# Patient Record
Sex: Female | Born: 1980
Health system: Southern US, Community
[De-identification: ages and names within clinical notes are randomized; demographics above are authoritative.]

## PROBLEM LIST (undated history)

## (undated) DIAGNOSIS — G40909 Epilepsy, unspecified, not intractable, without status epilepticus: Secondary | ICD-10-CM

---

## 2001-11-19 ENCOUNTER — Other Ambulatory Visit: Admission: RE | Admit: 2001-11-19 | Discharge: 2001-11-19 | Payer: Self-pay | Admitting: Family Medicine

## 2006-10-26 ENCOUNTER — Emergency Department (HOSPITAL_COMMUNITY): Admission: EM | Admit: 2006-10-26 | Discharge: 2006-10-27 | Payer: Self-pay | Admitting: *Deleted

## 2007-01-14 ENCOUNTER — Emergency Department (HOSPITAL_COMMUNITY): Admission: EM | Admit: 2007-01-14 | Discharge: 2007-01-14 | Payer: Self-pay | Admitting: Emergency Medicine

## 2007-05-18 ENCOUNTER — Emergency Department (HOSPITAL_COMMUNITY): Admission: EM | Admit: 2007-05-18 | Discharge: 2007-05-18 | Payer: Self-pay | Admitting: Emergency Medicine

## 2007-07-14 ENCOUNTER — Emergency Department (HOSPITAL_COMMUNITY): Admission: EM | Admit: 2007-07-14 | Discharge: 2007-07-14 | Payer: Self-pay | Admitting: Emergency Medicine

## 2007-12-15 ENCOUNTER — Encounter: Payer: Self-pay | Admitting: Emergency Medicine

## 2007-12-15 ENCOUNTER — Inpatient Hospital Stay (HOSPITAL_COMMUNITY): Admission: AD | Admit: 2007-12-15 | Discharge: 2007-12-16 | Payer: Self-pay | Admitting: *Deleted

## 2008-01-14 ENCOUNTER — Emergency Department (HOSPITAL_COMMUNITY): Admission: EM | Admit: 2008-01-14 | Discharge: 2008-01-14 | Payer: Self-pay | Admitting: Emergency Medicine

## 2008-11-20 ENCOUNTER — Emergency Department (HOSPITAL_COMMUNITY): Admission: EM | Admit: 2008-11-20 | Discharge: 2008-11-20 | Payer: Self-pay | Admitting: Emergency Medicine

## 2010-12-12 LAB — URINALYSIS, ROUTINE W REFLEX MICROSCOPIC
Bilirubin Urine: NEGATIVE
Glucose, UA: NEGATIVE mg/dL
Protein, ur: NEGATIVE mg/dL

## 2010-12-12 LAB — URINE MICROSCOPIC-ADD ON
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
## 2011-05-27 LAB — WET PREP, GENITAL
Clue Cells Wet Prep HPF POC: NONE SEEN
Trich, Wet Prep: NONE SEEN
Yeast Wet Prep HPF POC: NONE SEEN

## 2011-05-27 LAB — COMPREHENSIVE METABOLIC PANEL
ALT: 91 — ABNORMAL HIGH
AST: 23
CO2: 25
Chloride: 106
GFR calc Af Amer: >60
GFR calc non Af Amer: >60
Glucose, Bld: 98
Sodium: 139
Total Bilirubin: 0.4

## 2011-05-27 LAB — DIFFERENTIAL
Basophils Absolute: 0.1
Basophils Relative: 1
Basophils Relative: 0
Eosinophils Absolute: 0.2
Eosinophils Relative: 2
Lymphocytes Relative: 22
Lymphs Abs: 2.8
Monocytes Absolute: 1
Monocytes Relative: 8
Monocytes Relative: 9
Neutro Abs: 8.4 — ABNORMAL HIGH
Neutro Abs: 7.6
Neutrophils Relative %: 68
Neutrophils Relative %: 70

## 2011-05-27 LAB — CBC
HCT: 36
Hemoglobin: 12.3
MCHC: 34.1
MCHC: 34.8
MCV: 94
Platelets: 267
RBC: 3.83 — ABNORMAL LOW
RBC: 3.36 — ABNORMAL LOW
RDW: 15.1
WBC: 12.4 — ABNORMAL HIGH
WBC: 10.8 — ABNORMAL HIGH

## 2011-05-27 LAB — GC/CHLAMYDIA PROBE AMP, GENITAL
Chlamydia, DNA Probe: NEGATIVE
GC Probe Amp, Genital: NEGATIVE

## 2011-05-27 LAB — COMPREHENSIVE METABOLIC PANEL WITH GFR
Albumin: 3.4 — ABNORMAL LOW
Alkaline Phosphatase: 102
BUN: 13
Calcium: 8.6
Creatinine, Ser: 0.74
Potassium: 4.3
Total Protein: 6.4

## 2011-05-27 LAB — URINALYSIS, ROUTINE W REFLEX MICROSCOPIC
Bilirubin Urine: NEGATIVE
Glucose, UA: NEGATIVE
Hgb urine dipstick: NEGATIVE
Ketones, ur: NEGATIVE
Nitrite: NEGATIVE
Protein, ur: NEGATIVE
Specific Gravity, Urine: 1.016
Urobilinogen, UA: 0.2
pH: 6.5

## 2011-05-27 LAB — RPR: RPR Ser Ql: NONREACTIVE

## 2011-05-27 LAB — LIPASE, BLOOD: Lipase: 61 — ABNORMAL HIGH

## 2011-05-28 LAB — COMPREHENSIVE METABOLIC PANEL WITH GFR
AST: 19
BUN: 13
CO2: 25
Calcium: 8.9
Chloride: 103
Creatinine, Ser: 0.65
GFR calc non Af Amer: >60
Glucose, Bld: 105 — ABNORMAL HIGH
Total Bilirubin: 0.5

## 2011-05-28 LAB — URINALYSIS, ROUTINE W REFLEX MICROSCOPIC
Bilirubin Urine: NEGATIVE
Glucose, UA: NEGATIVE
Hgb urine dipstick: NEGATIVE
Ketones, ur: NEGATIVE
Nitrite: NEGATIVE
Protein, ur: NEGATIVE
Specific Gravity, Urine: 1.023
Urobilinogen, UA: 0.2
pH: 6

## 2011-05-28 LAB — COMPREHENSIVE METABOLIC PANEL
ALT: 15
Albumin: 3.4 — ABNORMAL LOW
Alkaline Phosphatase: 106
GFR calc Af Amer: >60
Potassium: 3.7
Sodium: 138
Total Protein: 6.4

## 2011-05-28 LAB — DIFFERENTIAL
Basophils Absolute: 0.1
Basophils Relative: 0
Eosinophils Absolute: 0.1
Eosinophils Relative: 1
Lymphocytes Relative: 20
Lymphs Abs: 2.7
Monocytes Absolute: 1
Monocytes Relative: 7
Neutro Abs: 9.9 — ABNORMAL HIGH
Neutrophils Relative %: 72

## 2011-05-28 LAB — CBC
HCT: 34.5 — ABNORMAL LOW
Hemoglobin: 12.2
MCHC: 35.4
MCV: 93.7
Platelets: 190
RBC: 3.69 — ABNORMAL LOW
RDW: 13.6
WBC: 13.7 — ABNORMAL HIGH

## 2011-05-28 LAB — RPR: RPR Ser Ql: NONREACTIVE

## 2011-05-28 LAB — WET PREP, GENITAL
Clue Cells Wet Prep HPF POC: NONE SEEN
Trich, Wet Prep: NONE SEEN
WBC, Wet Prep HPF POC: NONE SEEN
Yeast Wet Prep HPF POC: NONE SEEN

## 2011-05-28 LAB — POCT PREGNANCY, URINE
Operator id: 161631
Preg Test, Ur: NEGATIVE

## 2011-05-28 LAB — GC/CHLAMYDIA PROBE AMP, GENITAL
Chlamydia, DNA Probe: NEGATIVE
GC Probe Amp, Genital: NEGATIVE

## 2015-09-12 DIAGNOSIS — Z79899 Other long term (current) drug therapy: Secondary | ICD-10-CM | POA: Diagnosis not present

## 2015-09-12 DIAGNOSIS — J029 Acute pharyngitis, unspecified: Secondary | ICD-10-CM | POA: Diagnosis not present

## 2015-09-12 DIAGNOSIS — B9789 Other viral agents as the cause of diseases classified elsewhere: Secondary | ICD-10-CM | POA: Diagnosis not present

## 2015-09-12 DIAGNOSIS — H9209 Otalgia, unspecified ear: Secondary | ICD-10-CM | POA: Diagnosis not present

## 2015-09-12 DIAGNOSIS — J069 Acute upper respiratory infection, unspecified: Secondary | ICD-10-CM | POA: Diagnosis not present

## 2015-09-12 DIAGNOSIS — G40909 Epilepsy, unspecified, not intractable, without status epilepticus: Secondary | ICD-10-CM | POA: Diagnosis not present

## 2015-09-27 DIAGNOSIS — F3289 Other specified depressive episodes: Secondary | ICD-10-CM | POA: Diagnosis not present

## 2015-09-27 DIAGNOSIS — Z760 Encounter for issue of repeat prescription: Secondary | ICD-10-CM | POA: Diagnosis not present

## 2015-09-27 DIAGNOSIS — F418 Other specified anxiety disorders: Secondary | ICD-10-CM | POA: Diagnosis not present

## 2015-09-27 DIAGNOSIS — M792 Neuralgia and neuritis, unspecified: Secondary | ICD-10-CM | POA: Diagnosis not present

## 2015-09-27 DIAGNOSIS — F319 Bipolar disorder, unspecified: Secondary | ICD-10-CM | POA: Diagnosis not present

## 2015-09-28 DIAGNOSIS — L600 Ingrowing nail: Secondary | ICD-10-CM | POA: Diagnosis not present

## 2015-10-03 DIAGNOSIS — L600 Ingrowing nail: Secondary | ICD-10-CM | POA: Diagnosis not present

## 2015-10-10 DIAGNOSIS — L600 Ingrowing nail: Secondary | ICD-10-CM | POA: Diagnosis not present

## 2015-11-09 DIAGNOSIS — F17210 Nicotine dependence, cigarettes, uncomplicated: Secondary | ICD-10-CM | POA: Diagnosis not present

## 2015-11-09 DIAGNOSIS — Z885 Allergy status to narcotic agent status: Secondary | ICD-10-CM | POA: Diagnosis not present

## 2015-11-09 DIAGNOSIS — Z7982 Long term (current) use of aspirin: Secondary | ICD-10-CM | POA: Diagnosis not present

## 2015-11-09 DIAGNOSIS — Z888 Allergy status to other drugs, medicaments and biological substances status: Secondary | ICD-10-CM | POA: Diagnosis not present

## 2015-11-09 DIAGNOSIS — T7621XA Adult sexual abuse, suspected, initial encounter: Secondary | ICD-10-CM | POA: Diagnosis not present

## 2015-11-09 DIAGNOSIS — G40909 Epilepsy, unspecified, not intractable, without status epilepticus: Secondary | ICD-10-CM | POA: Diagnosis not present

## 2015-11-09 DIAGNOSIS — M797 Fibromyalgia: Secondary | ICD-10-CM | POA: Diagnosis not present

## 2015-11-09 DIAGNOSIS — M549 Dorsalgia, unspecified: Secondary | ICD-10-CM | POA: Diagnosis not present

## 2015-11-09 DIAGNOSIS — Z79891 Long term (current) use of opiate analgesic: Secondary | ICD-10-CM | POA: Diagnosis not present

## 2015-11-09 DIAGNOSIS — J45909 Unspecified asthma, uncomplicated: Secondary | ICD-10-CM | POA: Diagnosis not present

## 2015-11-09 DIAGNOSIS — T7421XA Adult sexual abuse, confirmed, initial encounter: Secondary | ICD-10-CM | POA: Diagnosis not present

## 2015-11-23 DIAGNOSIS — M797 Fibromyalgia: Secondary | ICD-10-CM | POA: Diagnosis not present

## 2015-11-23 DIAGNOSIS — R1110 Vomiting, unspecified: Secondary | ICD-10-CM | POA: Diagnosis not present

## 2015-11-23 DIAGNOSIS — J45909 Unspecified asthma, uncomplicated: Secondary | ICD-10-CM | POA: Diagnosis not present

## 2015-11-23 DIAGNOSIS — Z8659 Personal history of other mental and behavioral disorders: Secondary | ICD-10-CM | POA: Diagnosis not present

## 2015-11-23 DIAGNOSIS — R569 Unspecified convulsions: Secondary | ICD-10-CM | POA: Diagnosis not present

## 2015-11-23 DIAGNOSIS — F319 Bipolar disorder, unspecified: Secondary | ICD-10-CM | POA: Diagnosis not present

## 2015-11-23 DIAGNOSIS — Z885 Allergy status to narcotic agent status: Secondary | ICD-10-CM | POA: Diagnosis not present

## 2015-11-23 DIAGNOSIS — Z86010 Personal history of colonic polyps: Secondary | ICD-10-CM | POA: Diagnosis not present

## 2015-11-23 DIAGNOSIS — F4310 Post-traumatic stress disorder, unspecified: Secondary | ICD-10-CM | POA: Diagnosis not present

## 2015-11-23 DIAGNOSIS — K219 Gastro-esophageal reflux disease without esophagitis: Secondary | ICD-10-CM | POA: Diagnosis not present

## 2015-11-23 DIAGNOSIS — R112 Nausea with vomiting, unspecified: Secondary | ICD-10-CM | POA: Diagnosis not present

## 2015-11-23 DIAGNOSIS — F419 Anxiety disorder, unspecified: Secondary | ICD-10-CM | POA: Diagnosis not present

## 2015-11-23 DIAGNOSIS — Z8719 Personal history of other diseases of the digestive system: Secondary | ICD-10-CM | POA: Diagnosis not present

## 2015-11-23 DIAGNOSIS — Z79899 Other long term (current) drug therapy: Secondary | ICD-10-CM | POA: Diagnosis not present

## 2015-11-23 DIAGNOSIS — Z87891 Personal history of nicotine dependence: Secondary | ICD-10-CM | POA: Diagnosis not present

## 2015-11-23 DIAGNOSIS — Z888 Allergy status to other drugs, medicaments and biological substances status: Secondary | ICD-10-CM | POA: Diagnosis not present

## 2015-11-23 DIAGNOSIS — R109 Unspecified abdominal pain: Secondary | ICD-10-CM | POA: Diagnosis not present

## 2015-11-24 DIAGNOSIS — Z760 Encounter for issue of repeat prescription: Secondary | ICD-10-CM | POA: Diagnosis not present

## 2015-11-24 DIAGNOSIS — F17210 Nicotine dependence, cigarettes, uncomplicated: Secondary | ICD-10-CM | POA: Diagnosis not present

## 2015-11-24 DIAGNOSIS — M792 Neuralgia and neuritis, unspecified: Secondary | ICD-10-CM | POA: Diagnosis not present

## 2016-04-19 DIAGNOSIS — M792 Neuralgia and neuritis, unspecified: Secondary | ICD-10-CM | POA: Diagnosis not present

## 2016-04-19 DIAGNOSIS — Z79899 Other long term (current) drug therapy: Secondary | ICD-10-CM | POA: Diagnosis not present

## 2016-04-19 DIAGNOSIS — G40909 Epilepsy, unspecified, not intractable, without status epilepticus: Secondary | ICD-10-CM | POA: Diagnosis not present

## 2016-05-23 DIAGNOSIS — Z79899 Other long term (current) drug therapy: Secondary | ICD-10-CM | POA: Diagnosis not present

## 2016-05-23 DIAGNOSIS — M792 Neuralgia and neuritis, unspecified: Secondary | ICD-10-CM | POA: Diagnosis not present

## 2016-05-23 DIAGNOSIS — F17210 Nicotine dependence, cigarettes, uncomplicated: Secondary | ICD-10-CM | POA: Diagnosis not present

## 2016-05-23 DIAGNOSIS — G40909 Epilepsy, unspecified, not intractable, without status epilepticus: Secondary | ICD-10-CM | POA: Diagnosis not present

## 2016-05-25 DIAGNOSIS — Z5320 Procedure and treatment not carried out because of patient's decision for unspecified reasons: Secondary | ICD-10-CM | POA: Diagnosis not present

## 2016-05-25 DIAGNOSIS — R252 Cramp and spasm: Secondary | ICD-10-CM | POA: Diagnosis not present

## 2016-06-14 DIAGNOSIS — J45909 Unspecified asthma, uncomplicated: Secondary | ICD-10-CM | POA: Diagnosis not present

## 2016-06-14 DIAGNOSIS — N390 Urinary tract infection, site not specified: Secondary | ICD-10-CM | POA: Diagnosis not present

## 2016-06-14 DIAGNOSIS — G629 Polyneuropathy, unspecified: Secondary | ICD-10-CM | POA: Diagnosis not present

## 2016-06-14 DIAGNOSIS — Z885 Allergy status to narcotic agent status: Secondary | ICD-10-CM | POA: Diagnosis not present

## 2016-06-14 DIAGNOSIS — Z888 Allergy status to other drugs, medicaments and biological substances status: Secondary | ICD-10-CM | POA: Diagnosis not present

## 2016-06-14 DIAGNOSIS — G40909 Epilepsy, unspecified, not intractable, without status epilepticus: Secondary | ICD-10-CM | POA: Diagnosis not present

## 2016-06-14 DIAGNOSIS — F17210 Nicotine dependence, cigarettes, uncomplicated: Secondary | ICD-10-CM | POA: Diagnosis not present

## 2016-07-14 DIAGNOSIS — D72829 Elevated white blood cell count, unspecified: Secondary | ICD-10-CM | POA: Diagnosis not present

## 2016-07-14 DIAGNOSIS — L03315 Cellulitis of perineum: Secondary | ICD-10-CM | POA: Diagnosis not present

## 2016-07-14 DIAGNOSIS — R1909 Other intra-abdominal and pelvic swelling, mass and lump: Secondary | ICD-10-CM | POA: Diagnosis not present

## 2016-07-14 DIAGNOSIS — N9489 Other specified conditions associated with female genital organs and menstrual cycle: Secondary | ICD-10-CM | POA: Diagnosis not present

## 2016-07-14 DIAGNOSIS — N762 Acute vulvitis: Secondary | ICD-10-CM | POA: Diagnosis not present

## 2016-07-15 DIAGNOSIS — R569 Unspecified convulsions: Secondary | ICD-10-CM | POA: Diagnosis not present

## 2016-07-15 DIAGNOSIS — N762 Acute vulvitis: Secondary | ICD-10-CM | POA: Diagnosis not present

## 2016-07-16 DIAGNOSIS — N762 Acute vulvitis: Secondary | ICD-10-CM | POA: Diagnosis not present

## 2016-07-16 DIAGNOSIS — R569 Unspecified convulsions: Secondary | ICD-10-CM | POA: Diagnosis not present

## 2016-08-13 DIAGNOSIS — R0789 Other chest pain: Secondary | ICD-10-CM | POA: Diagnosis not present

## 2016-08-13 DIAGNOSIS — K047 Periapical abscess without sinus: Secondary | ICD-10-CM | POA: Diagnosis not present

## 2016-09-11 DIAGNOSIS — S61302A Unspecified open wound of right middle finger with damage to nail, initial encounter: Secondary | ICD-10-CM | POA: Diagnosis not present

## 2016-09-11 DIAGNOSIS — F17200 Nicotine dependence, unspecified, uncomplicated: Secondary | ICD-10-CM | POA: Diagnosis not present

## 2016-09-11 DIAGNOSIS — S61212A Laceration without foreign body of right middle finger without damage to nail, initial encounter: Secondary | ICD-10-CM | POA: Diagnosis not present

## 2016-09-11 DIAGNOSIS — R569 Unspecified convulsions: Secondary | ICD-10-CM | POA: Diagnosis not present

## 2016-09-11 DIAGNOSIS — S61409A Unspecified open wound of unspecified hand, initial encounter: Secondary | ICD-10-CM | POA: Diagnosis not present

## 2016-11-23 DIAGNOSIS — L0211 Cutaneous abscess of neck: Secondary | ICD-10-CM | POA: Diagnosis not present

## 2016-11-23 DIAGNOSIS — Z885 Allergy status to narcotic agent status: Secondary | ICD-10-CM | POA: Diagnosis not present

## 2016-11-23 DIAGNOSIS — Z880 Allergy status to penicillin: Secondary | ICD-10-CM | POA: Diagnosis not present

## 2016-11-23 DIAGNOSIS — R569 Unspecified convulsions: Secondary | ICD-10-CM | POA: Diagnosis not present

## 2016-11-23 DIAGNOSIS — F17200 Nicotine dependence, unspecified, uncomplicated: Secondary | ICD-10-CM | POA: Diagnosis not present

## 2016-11-25 DIAGNOSIS — S1096XA Insect bite of unspecified part of neck, initial encounter: Secondary | ICD-10-CM | POA: Diagnosis not present

## 2016-11-25 DIAGNOSIS — R1110 Vomiting, unspecified: Secondary | ICD-10-CM | POA: Diagnosis not present

## 2016-11-25 DIAGNOSIS — L03221 Cellulitis of neck: Secondary | ICD-10-CM | POA: Diagnosis not present

## 2016-11-25 DIAGNOSIS — L0211 Cutaneous abscess of neck: Secondary | ICD-10-CM | POA: Diagnosis not present

## 2016-12-23 DIAGNOSIS — N6320 Unspecified lump in the left breast, unspecified quadrant: Secondary | ICD-10-CM | POA: Diagnosis not present

## 2016-12-23 DIAGNOSIS — N7011 Chronic salpingitis: Secondary | ICD-10-CM | POA: Diagnosis not present

## 2016-12-23 DIAGNOSIS — N6452 Nipple discharge: Secondary | ICD-10-CM | POA: Diagnosis not present

## 2016-12-23 DIAGNOSIS — R102 Pelvic and perineal pain: Secondary | ICD-10-CM | POA: Diagnosis not present

## 2016-12-25 DIAGNOSIS — N6322 Unspecified lump in the left breast, upper inner quadrant: Secondary | ICD-10-CM | POA: Diagnosis not present

## 2016-12-25 DIAGNOSIS — N6452 Nipple discharge: Secondary | ICD-10-CM | POA: Diagnosis not present

## 2016-12-29 DIAGNOSIS — R928 Other abnormal and inconclusive findings on diagnostic imaging of breast: Secondary | ICD-10-CM | POA: Diagnosis not present

## 2017-01-13 DIAGNOSIS — D242 Benign neoplasm of left breast: Secondary | ICD-10-CM | POA: Diagnosis not present

## 2017-01-13 DIAGNOSIS — N61 Mastitis without abscess: Secondary | ICD-10-CM | POA: Diagnosis not present

## 2017-01-13 DIAGNOSIS — R928 Other abnormal and inconclusive findings on diagnostic imaging of breast: Secondary | ICD-10-CM | POA: Diagnosis not present

## 2017-01-13 DIAGNOSIS — N611 Abscess of the breast and nipple: Secondary | ICD-10-CM | POA: Diagnosis not present

## 2017-01-16 DIAGNOSIS — R509 Fever, unspecified: Secondary | ICD-10-CM | POA: Diagnosis not present

## 2017-01-16 DIAGNOSIS — J45909 Unspecified asthma, uncomplicated: Secondary | ICD-10-CM | POA: Diagnosis not present

## 2017-01-16 DIAGNOSIS — G8929 Other chronic pain: Secondary | ICD-10-CM | POA: Diagnosis not present

## 2017-01-16 DIAGNOSIS — B9789 Other viral agents as the cause of diseases classified elsewhere: Secondary | ICD-10-CM | POA: Diagnosis not present

## 2017-01-16 DIAGNOSIS — M791 Myalgia: Secondary | ICD-10-CM | POA: Diagnosis not present

## 2017-01-16 DIAGNOSIS — R05 Cough: Secondary | ICD-10-CM | POA: Diagnosis not present

## 2017-01-16 DIAGNOSIS — T814XXA Infection following a procedure, initial encounter: Secondary | ICD-10-CM | POA: Diagnosis not present

## 2017-01-16 DIAGNOSIS — Z880 Allergy status to penicillin: Secondary | ICD-10-CM | POA: Diagnosis not present

## 2017-01-16 DIAGNOSIS — H9209 Otalgia, unspecified ear: Secondary | ICD-10-CM | POA: Diagnosis not present

## 2017-01-16 DIAGNOSIS — F329 Major depressive disorder, single episode, unspecified: Secondary | ICD-10-CM | POA: Diagnosis not present

## 2017-01-16 DIAGNOSIS — N390 Urinary tract infection, site not specified: Secondary | ICD-10-CM | POA: Diagnosis not present

## 2017-01-16 DIAGNOSIS — G20 Parkinson's disease: Secondary | ICD-10-CM | POA: Diagnosis not present

## 2017-01-16 DIAGNOSIS — F17200 Nicotine dependence, unspecified, uncomplicated: Secondary | ICD-10-CM | POA: Diagnosis not present

## 2017-01-16 DIAGNOSIS — Z885 Allergy status to narcotic agent status: Secondary | ICD-10-CM | POA: Diagnosis not present

## 2017-01-16 DIAGNOSIS — D680 Von Willebrand's disease: Secondary | ICD-10-CM | POA: Diagnosis not present

## 2017-01-16 DIAGNOSIS — R0981 Nasal congestion: Secondary | ICD-10-CM | POA: Diagnosis not present

## 2017-01-29 DIAGNOSIS — Z5329 Procedure and treatment not carried out because of patient's decision for other reasons: Secondary | ICD-10-CM | POA: Diagnosis not present

## 2017-01-29 DIAGNOSIS — L7682 Other postprocedural complications of skin and subcutaneous tissue: Secondary | ICD-10-CM | POA: Diagnosis not present

## 2017-01-30 DIAGNOSIS — N644 Mastodynia: Secondary | ICD-10-CM | POA: Diagnosis not present

## 2017-01-30 DIAGNOSIS — Z9889 Other specified postprocedural states: Secondary | ICD-10-CM | POA: Diagnosis not present

## 2017-01-30 DIAGNOSIS — G8918 Other acute postprocedural pain: Secondary | ICD-10-CM | POA: Diagnosis not present

## 2017-05-03 DIAGNOSIS — S43101A Unspecified dislocation of right acromioclavicular joint, initial encounter: Secondary | ICD-10-CM | POA: Diagnosis not present

## 2017-07-23 DIAGNOSIS — N898 Other specified noninflammatory disorders of vagina: Secondary | ICD-10-CM | POA: Diagnosis not present

## 2017-07-23 DIAGNOSIS — Z202 Contact with and (suspected) exposure to infections with a predominantly sexual mode of transmission: Secondary | ICD-10-CM | POA: Diagnosis not present

## 2017-07-23 DIAGNOSIS — R102 Pelvic and perineal pain: Secondary | ICD-10-CM | POA: Diagnosis not present

## 2017-07-23 DIAGNOSIS — N766 Ulceration of vulva: Secondary | ICD-10-CM | POA: Diagnosis not present

## 2017-07-23 DIAGNOSIS — R110 Nausea: Secondary | ICD-10-CM | POA: Diagnosis not present

## 2017-08-27 DIAGNOSIS — G20 Parkinson's disease: Secondary | ICD-10-CM | POA: Diagnosis not present

## 2017-08-27 DIAGNOSIS — K089 Disorder of teeth and supporting structures, unspecified: Secondary | ICD-10-CM | POA: Diagnosis not present

## 2017-08-27 DIAGNOSIS — Z9889 Other specified postprocedural states: Secondary | ICD-10-CM | POA: Diagnosis not present

## 2017-08-27 DIAGNOSIS — Z90710 Acquired absence of both cervix and uterus: Secondary | ICD-10-CM | POA: Diagnosis not present

## 2017-08-27 DIAGNOSIS — K219 Gastro-esophageal reflux disease without esophagitis: Secondary | ICD-10-CM | POA: Diagnosis not present

## 2017-08-27 DIAGNOSIS — Z720 Tobacco use: Secondary | ICD-10-CM | POA: Diagnosis not present

## 2017-08-27 DIAGNOSIS — R220 Localized swelling, mass and lump, head: Secondary | ICD-10-CM | POA: Diagnosis not present

## 2017-08-27 DIAGNOSIS — L0201 Cutaneous abscess of face: Secondary | ICD-10-CM | POA: Diagnosis not present

## 2017-08-27 DIAGNOSIS — Z79899 Other long term (current) drug therapy: Secondary | ICD-10-CM | POA: Diagnosis not present

## 2017-08-27 DIAGNOSIS — Z9049 Acquired absence of other specified parts of digestive tract: Secondary | ICD-10-CM | POA: Diagnosis not present

## 2017-08-27 DIAGNOSIS — J45909 Unspecified asthma, uncomplicated: Secondary | ICD-10-CM | POA: Diagnosis not present

## 2017-08-27 DIAGNOSIS — Z888 Allergy status to other drugs, medicaments and biological substances status: Secondary | ICD-10-CM | POA: Diagnosis not present

## 2017-08-27 DIAGNOSIS — R569 Unspecified convulsions: Secondary | ICD-10-CM | POA: Diagnosis not present

## 2017-08-27 DIAGNOSIS — F419 Anxiety disorder, unspecified: Secondary | ICD-10-CM | POA: Diagnosis not present

## 2017-08-27 DIAGNOSIS — F17210 Nicotine dependence, cigarettes, uncomplicated: Secondary | ICD-10-CM | POA: Diagnosis not present

## 2017-08-27 DIAGNOSIS — F4310 Post-traumatic stress disorder, unspecified: Secondary | ICD-10-CM | POA: Diagnosis not present

## 2017-08-27 DIAGNOSIS — Z885 Allergy status to narcotic agent status: Secondary | ICD-10-CM | POA: Diagnosis not present

## 2017-08-27 DIAGNOSIS — F319 Bipolar disorder, unspecified: Secondary | ICD-10-CM | POA: Diagnosis not present

## 2017-08-27 DIAGNOSIS — K047 Periapical abscess without sinus: Secondary | ICD-10-CM | POA: Diagnosis not present

## 2017-08-27 DIAGNOSIS — M797 Fibromyalgia: Secondary | ICD-10-CM | POA: Diagnosis not present

## 2017-08-27 DIAGNOSIS — K0889 Other specified disorders of teeth and supporting structures: Secondary | ICD-10-CM | POA: Diagnosis not present

## 2017-09-08 DIAGNOSIS — R9401 Abnormal electroencephalogram [EEG]: Secondary | ICD-10-CM | POA: Diagnosis not present

## 2017-09-08 DIAGNOSIS — E876 Hypokalemia: Secondary | ICD-10-CM | POA: Diagnosis not present

## 2017-09-08 DIAGNOSIS — G40909 Epilepsy, unspecified, not intractable, without status epilepticus: Secondary | ICD-10-CM | POA: Diagnosis not present

## 2017-09-08 DIAGNOSIS — R569 Unspecified convulsions: Secondary | ICD-10-CM | POA: Diagnosis not present

## 2017-09-08 DIAGNOSIS — R4182 Altered mental status, unspecified: Secondary | ICD-10-CM | POA: Diagnosis not present

## 2017-10-13 DIAGNOSIS — M79671 Pain in right foot: Secondary | ICD-10-CM | POA: Diagnosis not present

## 2017-10-13 DIAGNOSIS — S99921A Unspecified injury of right foot, initial encounter: Secondary | ICD-10-CM | POA: Diagnosis not present

## 2017-10-29 DIAGNOSIS — W01198A Fall on same level from slipping, tripping and stumbling with subsequent striking against other object, initial encounter: Secondary | ICD-10-CM | POA: Diagnosis not present

## 2017-10-29 DIAGNOSIS — R569 Unspecified convulsions: Secondary | ICD-10-CM | POA: Diagnosis not present

## 2017-10-29 DIAGNOSIS — R51 Headache: Secondary | ICD-10-CM | POA: Diagnosis not present

## 2017-10-29 DIAGNOSIS — Z91120 Patient's intentional underdosing of medication regimen due to financial hardship: Secondary | ICD-10-CM | POA: Diagnosis not present

## 2017-10-29 DIAGNOSIS — Y999 Unspecified external cause status: Secondary | ICD-10-CM | POA: Diagnosis not present

## 2017-10-30 DIAGNOSIS — R569 Unspecified convulsions: Secondary | ICD-10-CM | POA: Diagnosis not present

## 2017-11-02 DIAGNOSIS — Z79891 Long term (current) use of opiate analgesic: Secondary | ICD-10-CM | POA: Diagnosis not present

## 2017-11-09 DIAGNOSIS — K029 Dental caries, unspecified: Secondary | ICD-10-CM | POA: Diagnosis not present

## 2017-11-09 DIAGNOSIS — X58XXXA Exposure to other specified factors, initial encounter: Secondary | ICD-10-CM | POA: Diagnosis not present

## 2017-11-09 DIAGNOSIS — S025XXA Fracture of tooth (traumatic), initial encounter for closed fracture: Secondary | ICD-10-CM | POA: Diagnosis not present

## 2017-11-09 DIAGNOSIS — Y999 Unspecified external cause status: Secondary | ICD-10-CM | POA: Diagnosis not present

## 2017-11-09 DIAGNOSIS — K0889 Other specified disorders of teeth and supporting structures: Secondary | ICD-10-CM | POA: Diagnosis not present

## 2017-11-09 DIAGNOSIS — K0381 Cracked tooth: Secondary | ICD-10-CM | POA: Diagnosis not present

## 2017-11-09 DIAGNOSIS — K047 Periapical abscess without sinus: Secondary | ICD-10-CM | POA: Diagnosis not present

## 2017-11-23 DIAGNOSIS — E876 Hypokalemia: Secondary | ICD-10-CM | POA: Diagnosis not present

## 2017-11-25 DIAGNOSIS — R0781 Pleurodynia: Secondary | ICD-10-CM | POA: Diagnosis not present

## 2017-11-25 DIAGNOSIS — Y999 Unspecified external cause status: Secondary | ICD-10-CM | POA: Diagnosis not present

## 2017-11-25 DIAGNOSIS — S299XXA Unspecified injury of thorax, initial encounter: Secondary | ICD-10-CM | POA: Diagnosis not present

## 2017-11-25 DIAGNOSIS — S63125A Dislocation of unspecified interphalangeal joint of left thumb, initial encounter: Secondary | ICD-10-CM | POA: Diagnosis not present

## 2017-11-25 DIAGNOSIS — S20212A Contusion of left front wall of thorax, initial encounter: Secondary | ICD-10-CM | POA: Diagnosis not present

## 2017-11-25 DIAGNOSIS — Z3202 Encounter for pregnancy test, result negative: Secondary | ICD-10-CM | POA: Diagnosis not present

## 2017-11-25 DIAGNOSIS — M79642 Pain in left hand: Secondary | ICD-10-CM | POA: Diagnosis not present

## 2017-11-25 DIAGNOSIS — S63291A Dislocation of distal interphalangeal joint of left index finger, initial encounter: Secondary | ICD-10-CM | POA: Diagnosis not present

## 2017-11-27 DIAGNOSIS — Z7689 Persons encountering health services in other specified circumstances: Secondary | ICD-10-CM | POA: Diagnosis not present

## 2017-11-27 DIAGNOSIS — G629 Polyneuropathy, unspecified: Secondary | ICD-10-CM | POA: Diagnosis not present

## 2017-11-27 DIAGNOSIS — G40909 Epilepsy, unspecified, not intractable, without status epilepticus: Secondary | ICD-10-CM | POA: Diagnosis not present

## 2017-11-27 DIAGNOSIS — F909 Attention-deficit hyperactivity disorder, unspecified type: Secondary | ICD-10-CM | POA: Diagnosis not present

## 2017-11-27 DIAGNOSIS — Z6829 Body mass index (BMI) 29.0-29.9, adult: Secondary | ICD-10-CM | POA: Diagnosis not present

## 2017-11-27 DIAGNOSIS — F3130 Bipolar disorder, current episode depressed, mild or moderate severity, unspecified: Secondary | ICD-10-CM | POA: Diagnosis not present

## 2017-12-04 DIAGNOSIS — S62525A Nondisplaced fracture of distal phalanx of left thumb, initial encounter for closed fracture: Secondary | ICD-10-CM | POA: Diagnosis not present

## 2017-12-08 DIAGNOSIS — S63105A Unspecified dislocation of left thumb, initial encounter: Secondary | ICD-10-CM | POA: Diagnosis not present

## 2017-12-08 DIAGNOSIS — Z6828 Body mass index (BMI) 28.0-28.9, adult: Secondary | ICD-10-CM | POA: Diagnosis not present

## 2017-12-24 DIAGNOSIS — Z79899 Other long term (current) drug therapy: Secondary | ICD-10-CM | POA: Diagnosis not present

## 2017-12-24 DIAGNOSIS — F909 Attention-deficit hyperactivity disorder, unspecified type: Secondary | ICD-10-CM | POA: Diagnosis not present

## 2017-12-24 DIAGNOSIS — H60509 Unspecified acute noninfective otitis externa, unspecified ear: Secondary | ICD-10-CM | POA: Diagnosis not present

## 2017-12-24 DIAGNOSIS — Z6828 Body mass index (BMI) 28.0-28.9, adult: Secondary | ICD-10-CM | POA: Diagnosis not present

## 2018-02-22 DIAGNOSIS — M1812 Unilateral primary osteoarthritis of first carpometacarpal joint, left hand: Secondary | ICD-10-CM | POA: Diagnosis not present

## 2018-02-22 DIAGNOSIS — S63279S Dislocation of unspecified interphalangeal joint of unspecified finger, sequela: Secondary | ICD-10-CM | POA: Diagnosis not present

## 2018-03-01 DIAGNOSIS — Z1331 Encounter for screening for depression: Secondary | ICD-10-CM | POA: Diagnosis not present

## 2018-03-01 DIAGNOSIS — F319 Bipolar disorder, unspecified: Secondary | ICD-10-CM | POA: Diagnosis not present

## 2018-03-01 DIAGNOSIS — F321 Major depressive disorder, single episode, moderate: Secondary | ICD-10-CM | POA: Diagnosis not present

## 2018-03-01 DIAGNOSIS — Z6828 Body mass index (BMI) 28.0-28.9, adult: Secondary | ICD-10-CM | POA: Diagnosis not present

## 2018-03-01 DIAGNOSIS — G629 Polyneuropathy, unspecified: Secondary | ICD-10-CM | POA: Diagnosis not present

## 2018-03-01 DIAGNOSIS — F909 Attention-deficit hyperactivity disorder, unspecified type: Secondary | ICD-10-CM | POA: Diagnosis not present

## 2018-03-01 DIAGNOSIS — F4310 Post-traumatic stress disorder, unspecified: Secondary | ICD-10-CM | POA: Diagnosis not present

## 2018-03-03 DIAGNOSIS — S63125D Dislocation of unspecified interphalangeal joint of left thumb, subsequent encounter: Secondary | ICD-10-CM | POA: Diagnosis not present

## 2018-03-03 DIAGNOSIS — S62525D Nondisplaced fracture of distal phalanx of left thumb, subsequent encounter for fracture with routine healing: Secondary | ICD-10-CM | POA: Diagnosis not present

## 2018-03-15 DIAGNOSIS — F321 Major depressive disorder, single episode, moderate: Secondary | ICD-10-CM | POA: Diagnosis not present

## 2018-03-15 DIAGNOSIS — F4310 Post-traumatic stress disorder, unspecified: Secondary | ICD-10-CM | POA: Diagnosis not present

## 2018-03-30 DIAGNOSIS — S62522S Displaced fracture of distal phalanx of left thumb, sequela: Secondary | ICD-10-CM | POA: Diagnosis not present

## 2018-03-30 DIAGNOSIS — M12542 Traumatic arthropathy, left hand: Secondary | ICD-10-CM | POA: Diagnosis not present

## 2018-03-30 DIAGNOSIS — M19142 Post-traumatic osteoarthritis, left hand: Secondary | ICD-10-CM | POA: Diagnosis not present

## 2018-03-30 DIAGNOSIS — G8918 Other acute postprocedural pain: Secondary | ICD-10-CM | POA: Diagnosis not present

## 2018-03-30 DIAGNOSIS — S62522A Displaced fracture of distal phalanx of left thumb, initial encounter for closed fracture: Secondary | ICD-10-CM | POA: Diagnosis not present

## 2018-03-30 DIAGNOSIS — M1832 Unilateral post-traumatic osteoarthritis of first carpometacarpal joint, left hand: Secondary | ICD-10-CM | POA: Diagnosis not present

## 2018-04-12 DIAGNOSIS — Z981 Arthrodesis status: Secondary | ICD-10-CM | POA: Diagnosis not present

## 2018-04-12 DIAGNOSIS — M79645 Pain in left finger(s): Secondary | ICD-10-CM | POA: Diagnosis not present

## 2018-04-16 DIAGNOSIS — S62502A Fracture of unspecified phalanx of left thumb, initial encounter for closed fracture: Secondary | ICD-10-CM | POA: Diagnosis not present

## 2018-04-21 DIAGNOSIS — F321 Major depressive disorder, single episode, moderate: Secondary | ICD-10-CM | POA: Diagnosis not present

## 2018-04-21 DIAGNOSIS — F4310 Post-traumatic stress disorder, unspecified: Secondary | ICD-10-CM | POA: Diagnosis not present

## 2018-04-29 DIAGNOSIS — F909 Attention-deficit hyperactivity disorder, unspecified type: Secondary | ICD-10-CM | POA: Diagnosis not present

## 2018-04-29 DIAGNOSIS — Z6828 Body mass index (BMI) 28.0-28.9, adult: Secondary | ICD-10-CM | POA: Diagnosis not present

## 2018-04-29 DIAGNOSIS — F319 Bipolar disorder, unspecified: Secondary | ICD-10-CM | POA: Diagnosis not present

## 2018-04-30 DIAGNOSIS — Z981 Arthrodesis status: Secondary | ICD-10-CM | POA: Diagnosis not present

## 2018-04-30 DIAGNOSIS — M79645 Pain in left finger(s): Secondary | ICD-10-CM | POA: Diagnosis not present

## 2019-01-11 DIAGNOSIS — F1111 Opioid abuse, in remission: Secondary | ICD-10-CM | POA: Diagnosis not present

## 2019-01-18 DIAGNOSIS — F1111 Opioid abuse, in remission: Secondary | ICD-10-CM | POA: Diagnosis not present

## 2019-01-24 DIAGNOSIS — Z79899 Other long term (current) drug therapy: Secondary | ICD-10-CM | POA: Diagnosis not present

## 2019-01-24 DIAGNOSIS — F1111 Opioid abuse, in remission: Secondary | ICD-10-CM | POA: Diagnosis not present

## 2019-01-24 DIAGNOSIS — F4310 Post-traumatic stress disorder, unspecified: Secondary | ICD-10-CM | POA: Diagnosis not present

## 2019-01-24 DIAGNOSIS — F411 Generalized anxiety disorder: Secondary | ICD-10-CM | POA: Diagnosis not present

## 2019-01-24 DIAGNOSIS — G6289 Other specified polyneuropathies: Secondary | ICD-10-CM | POA: Diagnosis not present

## 2019-01-24 DIAGNOSIS — F17210 Nicotine dependence, cigarettes, uncomplicated: Secondary | ICD-10-CM | POA: Diagnosis not present

## 2019-01-25 DIAGNOSIS — F4310 Post-traumatic stress disorder, unspecified: Secondary | ICD-10-CM | POA: Diagnosis not present

## 2019-01-25 DIAGNOSIS — F411 Generalized anxiety disorder: Secondary | ICD-10-CM | POA: Diagnosis not present

## 2019-01-25 DIAGNOSIS — F329 Major depressive disorder, single episode, unspecified: Secondary | ICD-10-CM | POA: Diagnosis not present

## 2019-01-25 DIAGNOSIS — F1111 Opioid abuse, in remission: Secondary | ICD-10-CM | POA: Diagnosis not present

## 2019-02-01 DIAGNOSIS — F4310 Post-traumatic stress disorder, unspecified: Secondary | ICD-10-CM | POA: Diagnosis not present

## 2019-02-01 DIAGNOSIS — F329 Major depressive disorder, single episode, unspecified: Secondary | ICD-10-CM | POA: Diagnosis not present

## 2019-02-01 DIAGNOSIS — F1111 Opioid abuse, in remission: Secondary | ICD-10-CM | POA: Diagnosis not present

## 2019-02-01 DIAGNOSIS — F411 Generalized anxiety disorder: Secondary | ICD-10-CM | POA: Diagnosis not present

## 2019-02-08 DIAGNOSIS — F1111 Opioid abuse, in remission: Secondary | ICD-10-CM | POA: Diagnosis not present

## 2019-02-15 DIAGNOSIS — F331 Major depressive disorder, recurrent, moderate: Secondary | ICD-10-CM | POA: Diagnosis not present

## 2019-02-15 DIAGNOSIS — Z634 Disappearance and death of family member: Secondary | ICD-10-CM | POA: Diagnosis not present

## 2019-02-15 DIAGNOSIS — F411 Generalized anxiety disorder: Secondary | ICD-10-CM | POA: Diagnosis not present

## 2019-02-15 DIAGNOSIS — F4320 Adjustment disorder, unspecified: Secondary | ICD-10-CM | POA: Diagnosis not present

## 2019-02-19 DIAGNOSIS — M546 Pain in thoracic spine: Secondary | ICD-10-CM | POA: Diagnosis not present

## 2019-02-19 DIAGNOSIS — F411 Generalized anxiety disorder: Secondary | ICD-10-CM | POA: Diagnosis not present

## 2019-02-19 DIAGNOSIS — Z79899 Other long term (current) drug therapy: Secondary | ICD-10-CM | POA: Diagnosis not present

## 2019-02-19 DIAGNOSIS — F1111 Opioid abuse, in remission: Secondary | ICD-10-CM | POA: Diagnosis not present

## 2019-02-19 DIAGNOSIS — F17210 Nicotine dependence, cigarettes, uncomplicated: Secondary | ICD-10-CM | POA: Diagnosis not present

## 2019-02-22 DIAGNOSIS — F1111 Opioid abuse, in remission: Secondary | ICD-10-CM | POA: Diagnosis not present

## 2019-02-28 DIAGNOSIS — J45901 Unspecified asthma with (acute) exacerbation: Secondary | ICD-10-CM | POA: Diagnosis not present

## 2019-03-01 DIAGNOSIS — F329 Major depressive disorder, single episode, unspecified: Secondary | ICD-10-CM | POA: Diagnosis not present

## 2019-03-01 DIAGNOSIS — F4310 Post-traumatic stress disorder, unspecified: Secondary | ICD-10-CM | POA: Diagnosis not present

## 2019-03-01 DIAGNOSIS — F419 Anxiety disorder, unspecified: Secondary | ICD-10-CM | POA: Diagnosis not present

## 2019-03-01 DIAGNOSIS — F17210 Nicotine dependence, cigarettes, uncomplicated: Secondary | ICD-10-CM | POA: Diagnosis not present

## 2019-03-01 DIAGNOSIS — Z131 Encounter for screening for diabetes mellitus: Secondary | ICD-10-CM | POA: Diagnosis not present

## 2019-03-01 DIAGNOSIS — E7800 Pure hypercholesterolemia, unspecified: Secondary | ICD-10-CM | POA: Diagnosis not present

## 2019-03-01 DIAGNOSIS — F909 Attention-deficit hyperactivity disorder, unspecified type: Secondary | ICD-10-CM | POA: Diagnosis not present

## 2019-03-01 DIAGNOSIS — Z79899 Other long term (current) drug therapy: Secondary | ICD-10-CM | POA: Diagnosis not present

## 2019-03-01 DIAGNOSIS — R5383 Other fatigue: Secondary | ICD-10-CM | POA: Diagnosis not present

## 2019-03-03 DIAGNOSIS — Z760 Encounter for issue of repeat prescription: Secondary | ICD-10-CM | POA: Diagnosis not present

## 2019-03-16 DIAGNOSIS — M4722 Other spondylosis with radiculopathy, cervical region: Secondary | ICD-10-CM | POA: Diagnosis not present

## 2019-03-16 DIAGNOSIS — M546 Pain in thoracic spine: Secondary | ICD-10-CM | POA: Diagnosis not present

## 2019-03-16 DIAGNOSIS — M545 Low back pain: Secondary | ICD-10-CM | POA: Diagnosis not present

## 2019-04-03 DIAGNOSIS — F17210 Nicotine dependence, cigarettes, uncomplicated: Secondary | ICD-10-CM | POA: Diagnosis not present

## 2019-04-03 DIAGNOSIS — M5136 Other intervertebral disc degeneration, lumbar region: Secondary | ICD-10-CM | POA: Diagnosis not present

## 2019-04-03 DIAGNOSIS — Z79899 Other long term (current) drug therapy: Secondary | ICD-10-CM | POA: Diagnosis not present

## 2019-04-03 DIAGNOSIS — G629 Polyneuropathy, unspecified: Secondary | ICD-10-CM | POA: Diagnosis not present

## 2019-04-12 DIAGNOSIS — Z760 Encounter for issue of repeat prescription: Secondary | ICD-10-CM | POA: Diagnosis not present

## 2019-04-28 DIAGNOSIS — F419 Anxiety disorder, unspecified: Secondary | ICD-10-CM | POA: Diagnosis not present

## 2019-04-28 DIAGNOSIS — F17210 Nicotine dependence, cigarettes, uncomplicated: Secondary | ICD-10-CM | POA: Diagnosis not present

## 2019-04-28 DIAGNOSIS — Z79899 Other long term (current) drug therapy: Secondary | ICD-10-CM | POA: Diagnosis not present

## 2019-04-28 DIAGNOSIS — Z87891 Personal history of nicotine dependence: Secondary | ICD-10-CM | POA: Diagnosis not present

## 2019-04-28 DIAGNOSIS — Z1159 Encounter for screening for other viral diseases: Secondary | ICD-10-CM | POA: Diagnosis not present

## 2019-05-10 DIAGNOSIS — M79641 Pain in right hand: Secondary | ICD-10-CM | POA: Diagnosis not present

## 2019-05-10 DIAGNOSIS — Z79891 Long term (current) use of opiate analgesic: Secondary | ICD-10-CM | POA: Diagnosis not present

## 2019-05-10 DIAGNOSIS — Z79899 Other long term (current) drug therapy: Secondary | ICD-10-CM | POA: Diagnosis not present

## 2019-05-10 DIAGNOSIS — F1120 Opioid dependence, uncomplicated: Secondary | ICD-10-CM | POA: Diagnosis not present

## 2019-06-18 DIAGNOSIS — Z79891 Long term (current) use of opiate analgesic: Secondary | ICD-10-CM | POA: Diagnosis not present

## 2019-06-18 DIAGNOSIS — Z7189 Other specified counseling: Secondary | ICD-10-CM | POA: Diagnosis not present

## 2019-06-18 DIAGNOSIS — Z79899 Other long term (current) drug therapy: Secondary | ICD-10-CM | POA: Diagnosis not present

## 2019-06-18 DIAGNOSIS — F17210 Nicotine dependence, cigarettes, uncomplicated: Secondary | ICD-10-CM | POA: Diagnosis not present

## 2019-06-21 DIAGNOSIS — J4530 Mild persistent asthma, uncomplicated: Secondary | ICD-10-CM | POA: Diagnosis not present

## 2019-06-21 DIAGNOSIS — J301 Allergic rhinitis due to pollen: Secondary | ICD-10-CM | POA: Diagnosis not present

## 2019-06-26 DIAGNOSIS — J209 Acute bronchitis, unspecified: Secondary | ICD-10-CM | POA: Diagnosis not present

## 2019-07-01 DIAGNOSIS — R05 Cough: Secondary | ICD-10-CM | POA: Diagnosis not present

## 2019-07-02 DIAGNOSIS — F17210 Nicotine dependence, cigarettes, uncomplicated: Secondary | ICD-10-CM | POA: Diagnosis not present

## 2019-07-02 DIAGNOSIS — F419 Anxiety disorder, unspecified: Secondary | ICD-10-CM | POA: Diagnosis not present

## 2019-07-02 DIAGNOSIS — F909 Attention-deficit hyperactivity disorder, unspecified type: Secondary | ICD-10-CM | POA: Diagnosis not present

## 2019-07-02 DIAGNOSIS — J209 Acute bronchitis, unspecified: Secondary | ICD-10-CM | POA: Diagnosis not present

## 2019-07-02 DIAGNOSIS — F4310 Post-traumatic stress disorder, unspecified: Secondary | ICD-10-CM | POA: Diagnosis not present

## 2019-07-02 DIAGNOSIS — F329 Major depressive disorder, single episode, unspecified: Secondary | ICD-10-CM | POA: Diagnosis not present

## 2019-07-22 DIAGNOSIS — M25531 Pain in right wrist: Secondary | ICD-10-CM | POA: Diagnosis not present

## 2019-07-22 DIAGNOSIS — M654 Radial styloid tenosynovitis [de Quervain]: Secondary | ICD-10-CM | POA: Diagnosis not present

## 2019-07-22 DIAGNOSIS — J019 Acute sinusitis, unspecified: Secondary | ICD-10-CM | POA: Diagnosis not present

## 2019-07-27 DIAGNOSIS — K0889 Other specified disorders of teeth and supporting structures: Secondary | ICD-10-CM | POA: Diagnosis not present

## 2020-02-28 ENCOUNTER — Emergency Department (HOSPITAL_COMMUNITY): Payer: Medicare Other

## 2020-02-28 ENCOUNTER — Inpatient Hospital Stay (HOSPITAL_COMMUNITY)
Admission: EM | Admit: 2020-02-28 | Discharge: 2020-03-04 | DRG: 519 | Disposition: A | Discharge status: Home Health | Payer: Medicare Other | Attending: Orthopaedic Surgery | Admitting: Orthopaedic Surgery

## 2020-02-28 DIAGNOSIS — F17210 Nicotine dependence, cigarettes, uncomplicated: Secondary | ICD-10-CM | POA: Diagnosis present

## 2020-02-28 DIAGNOSIS — S32811A Multiple fractures of pelvis with unstable disruption of pelvic ring, initial encounter for closed fracture: Secondary | ICD-10-CM | POA: Diagnosis not present

## 2020-02-28 DIAGNOSIS — S81812A Laceration without foreign body, left lower leg, initial encounter: Secondary | ICD-10-CM | POA: Diagnosis present

## 2020-02-28 DIAGNOSIS — G8929 Other chronic pain: Secondary | ICD-10-CM | POA: Diagnosis present

## 2020-02-28 DIAGNOSIS — D62 Acute posthemorrhagic anemia: Secondary | ICD-10-CM | POA: Diagnosis not present

## 2020-02-28 DIAGNOSIS — Z79891 Long term (current) use of opiate analgesic: Secondary | ICD-10-CM

## 2020-02-28 DIAGNOSIS — T148XXA Other injury of unspecified body region, initial encounter: Secondary | ICD-10-CM

## 2020-02-28 DIAGNOSIS — S32009A Unspecified fracture of unspecified lumbar vertebra, initial encounter for closed fracture: Secondary | ICD-10-CM

## 2020-02-28 DIAGNOSIS — Z20822 Contact with and (suspected) exposure to covid-19: Secondary | ICD-10-CM | POA: Diagnosis present

## 2020-02-28 DIAGNOSIS — S31114A Laceration without foreign body of abdominal wall, left lower quadrant without penetration into peritoneal cavity, initial encounter: Secondary | ICD-10-CM | POA: Diagnosis present

## 2020-02-28 DIAGNOSIS — S32810A Multiple fractures of pelvis with stable disruption of pelvic ring, initial encounter for closed fracture: Secondary | ICD-10-CM | POA: Diagnosis present

## 2020-02-28 DIAGNOSIS — S3210XA Unspecified fracture of sacrum, initial encounter for closed fracture: Secondary | ICD-10-CM

## 2020-02-28 DIAGNOSIS — R339 Retention of urine, unspecified: Secondary | ICD-10-CM | POA: Diagnosis not present

## 2020-02-28 DIAGNOSIS — Z419 Encounter for procedure for purposes other than remedying health state, unspecified: Secondary | ICD-10-CM

## 2020-02-28 DIAGNOSIS — S32119A Unspecified Zone I fracture of sacrum, initial encounter for closed fracture: Secondary | ICD-10-CM | POA: Diagnosis present

## 2020-02-28 DIAGNOSIS — S329XXA Fracture of unspecified parts of lumbosacral spine and pelvis, initial encounter for closed fracture: Secondary | ICD-10-CM

## 2020-02-28 DIAGNOSIS — Z23 Encounter for immunization: Secondary | ICD-10-CM

## 2020-02-28 DIAGNOSIS — E8889 Other specified metabolic disorders: Secondary | ICD-10-CM | POA: Diagnosis present

## 2020-02-28 DIAGNOSIS — Z79899 Other long term (current) drug therapy: Secondary | ICD-10-CM

## 2020-02-28 DIAGNOSIS — Y92410 Unspecified street and highway as the place of occurrence of the external cause: Secondary | ICD-10-CM

## 2020-02-28 DIAGNOSIS — G40909 Epilepsy, unspecified, not intractable, without status epilepticus: Secondary | ICD-10-CM | POA: Diagnosis present

## 2020-02-28 DIAGNOSIS — M545 Low back pain: Secondary | ICD-10-CM | POA: Diagnosis not present

## 2020-02-28 HISTORY — DX: Epilepsy, unspecified, not intractable, without status epilepticus: G40.909

## 2020-02-28 LAB — COMPREHENSIVE METABOLIC PANEL
ALT: 64 U/L — ABNORMAL HIGH (ref 0–44)
AST: 56 U/L — ABNORMAL HIGH (ref 15–41)
Albumin: 3.9 g/dL (ref 3.5–5.0)
Alkaline Phosphatase: 95 U/L (ref 38–126)
Anion gap: 8 (ref 5–15)
BUN: 14 mg/dL (ref 6–20)
CO2: 25 mmol/L (ref 22–32)
Calcium: 9.1 mg/dL (ref 8.9–10.3)
Chloride: 109 mmol/L (ref 98–111)
Creatinine, Ser: 0.78 mg/dL (ref 0.44–1.00)
GFR calc Af Amer: >60 mL/min (ref >60)
GFR calc non Af Amer: >60 mL/min (ref >60)
Glucose, Bld: 78 mg/dL (ref 70–99)
Potassium: 3.7 mmol/L (ref 3.5–5.1)
Sodium: 142 mmol/L (ref 135–145)
Total Bilirubin: 0.9 mg/dL (ref 0.3–1.2)
Total Protein: 7.3 g/dL (ref 6.5–8.1)

## 2020-02-28 LAB — I-STAT CHEM 8, ED
BUN: 16 mg/dL (ref 6–20)
Calcium, Ion: 1.11 mmol/L — ABNORMAL LOW (ref 1.15–1.40)
Chloride: 106 mmol/L (ref 98–111)
Creatinine, Ser: 0.8 mg/dL (ref 0.44–1.00)
Glucose, Bld: 76 mg/dL (ref 70–99)
HCT: 38 % (ref 36.0–46.0)
Hemoglobin: 12.9 g/dL (ref 12.0–15.0)
Potassium: 3.7 mmol/L (ref 3.5–5.1)
Sodium: 144 mmol/L (ref 135–145)
TCO2: 24 mmol/L (ref 22–32)

## 2020-02-28 LAB — PROTIME-INR
INR: 1.1 (ref 0.8–1.2)
Prothrombin Time: 13.4 seconds (ref 11.4–15.2)

## 2020-02-28 LAB — CBC
HCT: 38.9 % (ref 36.0–46.0)
Hemoglobin: 12.9 g/dL (ref 12.0–15.0)
MCH: 32.1 pg (ref 26.0–34.0)
MCHC: 33.2 g/dL (ref 30.0–36.0)
MCV: 96.8 fL (ref 80.0–100.0)
Platelets: 178 10*3/uL (ref 150–400)
RBC: 4.02 MIL/uL (ref 3.87–5.11)
RDW: 11.9 % (ref 11.5–15.5)
WBC: 11.4 10*3/uL — ABNORMAL HIGH (ref 4.0–10.5)
nRBC: 0 % (ref 0.0–0.2)

## 2020-02-28 LAB — SAMPLE TO BLOOD BANK

## 2020-02-28 LAB — I-STAT BETA HCG BLOOD, ED (MC, WL, AP ONLY): I-stat hCG, quantitative: <5 m[IU]/mL (ref <5)

## 2020-02-28 LAB — LACTIC ACID, PLASMA: Lactic Acid, Venous: 1 mmol/L (ref 0.5–1.9)

## 2020-02-28 MED ORDER — ONDANSETRON HCL 4 MG/2ML IJ SOLN
4.0000 mg | Freq: Once | INTRAMUSCULAR | Status: AC
  Administered 2020-02-28: 4 mg via INTRAVENOUS

## 2020-02-28 MED ORDER — SODIUM CHLORIDE 0.9 % IV BOLUS
1000.0000 mL | Freq: Once | INTRAVENOUS | Status: AC
  Administered 2020-02-28: 1000 mL via INTRAVENOUS

## 2020-02-28 MED ORDER — MORPHINE SULFATE (PF) 4 MG/ML IV SOLN
4.0000 mg | Freq: Once | INTRAVENOUS | Status: AC
  Administered 2020-02-28: 4 mg via INTRAVENOUS

## 2020-02-28 MED ORDER — TETANUS-DIPHTH-ACELL PERTUSSIS 5-2.5-18.5 LF-MCG/0.5 IM SUSP
0.5000 mL | Freq: Once | INTRAMUSCULAR | Status: AC
  Administered 2020-02-29: 0.5 mL via INTRAMUSCULAR
  Filled 2020-02-28: qty 0.5
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
## 2020-02-28 NOTE — ED Notes (Signed)
CT notified of need for scan.
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
## 2020-02-28 NOTE — ED Notes (Signed)
MD notified of BP change to 90's/50's. 1 L NS Bolus ordered

## 2020-02-28 NOTE — ED Notes (Signed)
This tech was able to draw the labs and have sent them down to the main lab.

## 2020-02-29 ENCOUNTER — Inpatient Hospital Stay (HOSPITAL_COMMUNITY): Payer: Medicare Other

## 2020-02-29 ENCOUNTER — Emergency Department (HOSPITAL_COMMUNITY): Payer: Medicare Other

## 2020-02-29 ENCOUNTER — Encounter (HOSPITAL_COMMUNITY): Payer: Self-pay | Admitting: Orthopedic Surgery

## 2020-02-29 DIAGNOSIS — Z79899 Other long term (current) drug therapy: Secondary | ICD-10-CM | POA: Diagnosis not present

## 2020-02-29 DIAGNOSIS — S32119A Unspecified Zone I fracture of sacrum, initial encounter for closed fracture: Secondary | ICD-10-CM | POA: Diagnosis present

## 2020-02-29 DIAGNOSIS — E8889 Other specified metabolic disorders: Secondary | ICD-10-CM | POA: Diagnosis present

## 2020-02-29 DIAGNOSIS — S81812A Laceration without foreign body, left lower leg, initial encounter: Secondary | ICD-10-CM | POA: Diagnosis present

## 2020-02-29 DIAGNOSIS — G40909 Epilepsy, unspecified, not intractable, without status epilepticus: Secondary | ICD-10-CM | POA: Diagnosis present

## 2020-02-29 DIAGNOSIS — S32810A Multiple fractures of pelvis with stable disruption of pelvic ring, initial encounter for closed fracture: Secondary | ICD-10-CM | POA: Diagnosis present

## 2020-02-29 DIAGNOSIS — R339 Retention of urine, unspecified: Secondary | ICD-10-CM | POA: Diagnosis not present

## 2020-02-29 DIAGNOSIS — S32811A Multiple fractures of pelvis with unstable disruption of pelvic ring, initial encounter for closed fracture: Secondary | ICD-10-CM | POA: Diagnosis present

## 2020-02-29 DIAGNOSIS — G8929 Other chronic pain: Secondary | ICD-10-CM | POA: Diagnosis present

## 2020-02-29 DIAGNOSIS — Z79891 Long term (current) use of opiate analgesic: Secondary | ICD-10-CM | POA: Diagnosis not present

## 2020-02-29 DIAGNOSIS — S31114A Laceration without foreign body of abdominal wall, left lower quadrant without penetration into peritoneal cavity, initial encounter: Secondary | ICD-10-CM | POA: Diagnosis present

## 2020-02-29 DIAGNOSIS — Z23 Encounter for immunization: Secondary | ICD-10-CM | POA: Diagnosis present

## 2020-02-29 DIAGNOSIS — F17210 Nicotine dependence, cigarettes, uncomplicated: Secondary | ICD-10-CM | POA: Diagnosis present

## 2020-02-29 DIAGNOSIS — D62 Acute posthemorrhagic anemia: Secondary | ICD-10-CM | POA: Diagnosis not present

## 2020-02-29 DIAGNOSIS — Z20822 Contact with and (suspected) exposure to covid-19: Secondary | ICD-10-CM | POA: Diagnosis present

## 2020-02-29 DIAGNOSIS — M545 Low back pain: Secondary | ICD-10-CM | POA: Diagnosis present

## 2020-02-29 DIAGNOSIS — Y92410 Unspecified street and highway as the place of occurrence of the external cause: Secondary | ICD-10-CM | POA: Diagnosis not present

## 2020-02-29 LAB — ETHANOL: Alcohol, Ethyl (B): <10 mg/dL (ref <10)

## 2020-02-29 LAB — SARS CORONAVIRUS 2 BY RT PCR (HOSPITAL ORDER, PERFORMED IN ~~LOC~~ HOSPITAL LAB): SARS Coronavirus 2: NEGATIVE

## 2020-02-29 MED ORDER — MORPHINE SULFATE (PF) 2 MG/ML IV SOLN
2.0000 mg | INTRAVENOUS | Status: DC | PRN
  Administered 2020-02-29 – 2020-03-02 (×7): 2 mg via INTRAVENOUS
  Filled 2020-02-29 (×7): qty 1

## 2020-02-29 MED ORDER — OXYCODONE HCL 5 MG PO TABS
5.0000 mg | ORAL_TABLET | ORAL | Status: DC | PRN
  Administered 2020-02-29: 15 mg via ORAL
  Administered 2020-03-01 (×2): 10 mg via ORAL
  Administered 2020-03-01: 15 mg via ORAL
  Administered 2020-03-02: 10 mg via ORAL
  Administered 2020-03-02: 5 mg via ORAL
  Administered 2020-03-03 (×4): 10 mg via ORAL
  Administered 2020-03-04: 15 mg via ORAL
  Filled 2020-02-29: qty 3
  Filled 2020-02-29 (×3): qty 2
  Filled 2020-02-29: qty 3
  Filled 2020-02-29 (×2): qty 2
  Filled 2020-02-29: qty 3
  Filled 2020-02-29 (×2): qty 2
  Filled 2020-02-29: qty 1
  Filled 2020-02-29: qty 3

## 2020-02-29 MED ORDER — METHOCARBAMOL 500 MG PO TABS
750.0000 mg | ORAL_TABLET | Freq: Four times a day (QID) | ORAL | Status: DC
  Administered 2020-03-01 – 2020-03-04 (×14): 750 mg via ORAL
  Filled 2020-02-29 (×14): qty 2

## 2020-02-29 MED ORDER — HYDROMORPHONE HCL 1 MG/ML IJ SOLN
1.0000 mg | Freq: Once | INTRAMUSCULAR | Status: AC
  Administered 2020-02-29: 1 mg via INTRAVENOUS
  Filled 2020-02-29: qty 1

## 2020-02-29 MED ORDER — GABAPENTIN 400 MG PO CAPS
800.0000 mg | ORAL_CAPSULE | Freq: Three times a day (TID) | ORAL | Status: DC
  Administered 2020-02-29 – 2020-03-04 (×12): 800 mg via ORAL
  Filled 2020-02-29: qty 2
  Filled 2020-02-29: qty 8
  Filled 2020-02-29 (×10): qty 2

## 2020-02-29 MED ORDER — PREGABALIN 100 MG PO CAPS
100.0000 mg | ORAL_CAPSULE | Freq: Three times a day (TID) | ORAL | Status: DC
  Administered 2020-02-29: 100 mg via ORAL
  Filled 2020-02-29: qty 1

## 2020-02-29 MED ORDER — METHADONE HCL 10 MG PO TABS
200.0000 mg | ORAL_TABLET | Freq: Every day | ORAL | Status: DC
  Administered 2020-02-29 – 2020-03-04 (×5): 200 mg via ORAL
  Filled 2020-02-29 (×5): qty 20

## 2020-02-29 MED ORDER — GABAPENTIN 800 MG PO TABS: 800.0000 mg | ORAL_TABLET | Freq: Three times a day (TID) | ORAL | Status: DC

## 2020-02-29 MED ORDER — ONDANSETRON HCL 4 MG/2ML IJ SOLN
4.0000 mg | Freq: Four times a day (QID) | INTRAMUSCULAR | Status: DC | PRN
  Administered 2020-02-29: 4 mg via INTRAVENOUS
  Filled 2020-02-29: qty 2

## 2020-02-29 MED ORDER — PREGABALIN 25 MG PO CAPS
50.0000 mg | ORAL_CAPSULE | Freq: Three times a day (TID) | ORAL | Status: DC
  Administered 2020-02-29 – 2020-03-01 (×4): 50 mg via ORAL
  Filled 2020-02-29 (×4): qty 2

## 2020-02-29 MED ORDER — HYDROCHLOROTHIAZIDE 12.5 MG PO CAPS
12.5000 mg | ORAL_CAPSULE | Freq: Every day | ORAL | Status: DC
  Administered 2020-02-29 – 2020-03-03 (×2): 12.5 mg via ORAL
  Filled 2020-02-29 (×4): qty 1

## 2020-02-29 MED ORDER — MORPHINE SULFATE (PF) 4 MG/ML IV SOLN
4.0000 mg | Freq: Once | INTRAVENOUS | Status: AC
  Administered 2020-02-29: 4 mg via INTRAVENOUS
  Filled 2020-02-29: qty 1

## 2020-02-29 MED ORDER — METHOCARBAMOL 1000 MG/10ML IJ SOLN
500.0000 mg | Freq: Four times a day (QID) | INTRAVENOUS | Status: DC
  Administered 2020-02-29: 500 mg via INTRAVENOUS
  Filled 2020-02-29 (×23): qty 5

## 2020-02-29 MED ORDER — ACETAMINOPHEN 500 MG PO TABS
1000.0000 mg | ORAL_TABLET | Freq: Three times a day (TID) | ORAL | Status: DC
  Administered 2020-02-29 – 2020-03-04 (×11): 1000 mg via ORAL
  Filled 2020-02-29 (×11): qty 2

## 2020-02-29 MED ORDER — LIDOCAINE-EPINEPHRINE (PF) 2 %-1:200000 IJ SOLN
20.0000 mL | Freq: Once | INTRAMUSCULAR | Status: AC
  Administered 2020-02-29: 20 mL
  Filled 2020-02-29: qty 20

## 2020-02-29 MED ORDER — LACOSAMIDE 50 MG PO TABS: 200.0000 mg | ORAL_TABLET | Freq: Two times a day (BID) | ORAL | Status: DC

## 2020-02-29 MED ORDER — IOHEXOL 300 MG/ML  SOLN
100.0000 mL | Freq: Once | INTRAMUSCULAR | Status: AC | PRN
  Administered 2020-02-29: 100 mL via INTRAVENOUS

## 2020-02-29 MED ORDER — AMPHETAMINE-DEXTROAMPHETAMINE 10 MG PO TABS
30.0000 mg | ORAL_TABLET | Freq: Two times a day (BID) | ORAL | Status: DC
  Filled 2020-02-29: qty 1

## 2020-02-29 MED ORDER — ENOXAPARIN SODIUM 40 MG/0.4ML ~~LOC~~ SOLN
40.0000 mg | SUBCUTANEOUS | Status: DC
  Administered 2020-02-29 – 2020-03-01 (×2): 40 mg via SUBCUTANEOUS
  Filled 2020-02-29 (×2): qty 0.4

## 2020-02-29 MED ORDER — AMPHETAMINE-DEXTROAMPHETAMINE 10 MG PO TABS
30.0000 mg | ORAL_TABLET | Freq: Two times a day (BID) | ORAL | Status: DC
  Administered 2020-03-01 – 2020-03-04 (×7): 30 mg via ORAL
  Filled 2020-02-29 (×7): qty 3

## 2020-02-29 MED ORDER — ONDANSETRON HCL 4 MG PO TABS: 4.0000 mg | ORAL_TABLET | Freq: Four times a day (QID) | ORAL | Status: DC | PRN

## 2020-02-29 MED ORDER — AMPHETAMINE-DEXTROAMPHETAMINE 30 MG PO TABS: 30.0000 mg | ORAL_TABLET | Freq: Two times a day (BID) | ORAL | Status: DC

## 2020-02-29 MED ORDER — LACTATED RINGERS IV SOLN: INTRAVENOUS | Status: DC

## 2020-02-29 MED ORDER — METHOCARBAMOL 1000 MG/10ML IJ SOLN
500.0000 mg | Freq: Four times a day (QID) | INTRAVENOUS | Status: DC | PRN
  Filled 2020-02-29: qty 5

## 2020-02-29 MED ORDER — LACOSAMIDE 200 MG PO TABS
200.0000 mg | ORAL_TABLET | Freq: Two times a day (BID) | ORAL | Status: DC
  Administered 2020-02-29 – 2020-03-04 (×7): 200 mg via ORAL
  Filled 2020-02-29: qty 4
  Filled 2020-02-29 (×6): qty 1

## 2020-02-29 MED ORDER — METHOCARBAMOL 500 MG PO TABS
500.0000 mg | ORAL_TABLET | Freq: Four times a day (QID) | ORAL | Status: DC | PRN
  Administered 2020-02-29: 500 mg via ORAL
  Filled 2020-02-29: qty 1
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
## 2020-02-29 NOTE — ED Notes (Signed)
Dr. Darcey Nora at bedside
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
## 2020-02-29 NOTE — ED Notes (Signed)
Pt having extreme pain, noted to be crying out and yelling. 10/10 pain. Given prescribed Methadone and PRN muscle spasm meds

## 2020-02-29 NOTE — ED Notes (Signed)
Patrick Jupiter, father, [phone] would like an update when available

## 2020-03-01 ENCOUNTER — Encounter (HOSPITAL_COMMUNITY): Payer: Self-pay | Admitting: Orthopedic Surgery

## 2020-03-01 ENCOUNTER — Other Ambulatory Visit: Payer: Self-pay

## 2020-03-01 LAB — SURGICAL PCR SCREEN
MRSA, PCR: NEGATIVE
Staphylococcus aureus: NEGATIVE

## 2020-03-01 LAB — RAPID URINE DRUG SCREEN, HOSP PERFORMED
Amphetamines: POSITIVE — AB
Barbiturates: NOT DETECTED
Benzodiazepines: POSITIVE — AB
Cocaine: NOT DETECTED
Opiates: POSITIVE — AB
Tetrahydrocannabinol: NOT DETECTED

## 2020-03-01 LAB — HIV ANTIBODY (ROUTINE TESTING W REFLEX): HIV Screen 4th Generation wRfx: NONREACTIVE

## 2020-03-01 MED ORDER — CEFAZOLIN SODIUM-DEXTROSE 2-4 GM/100ML-% IV SOLN
2.0000 g | INTRAVENOUS | Status: AC
  Administered 2020-03-02: 2 g via INTRAVENOUS
  Filled 2020-03-01: qty 100

## 2020-03-01 MED ORDER — PREGABALIN 100 MG PO CAPS
100.0000 mg | ORAL_CAPSULE | Freq: Three times a day (TID) | ORAL | Status: DC
  Administered 2020-03-01 – 2020-03-04 (×8): 100 mg via ORAL
  Filled 2020-03-01 (×8): qty 1

## 2020-03-01 MED ORDER — POVIDONE-IODINE 10 % EX SWAB: 2.0000 "application " | Freq: Once | CUTANEOUS | Status: DC

## 2020-03-01 MED ORDER — CHLORHEXIDINE GLUCONATE 4 % EX LIQD
60.0000 mL | Freq: Once | CUTANEOUS | Status: AC
  Administered 2020-03-01: 4 via TOPICAL
  Filled 2020-03-01: qty 60

## 2020-03-01 MED ORDER — TAMSULOSIN HCL 0.4 MG PO CAPS
0.4000 mg | ORAL_CAPSULE | Freq: Every day | ORAL | Status: DC
  Administered 2020-03-01 – 2020-03-03 (×3): 0.4 mg via ORAL
  Filled 2020-03-01 (×3): qty 1

## 2020-03-01 MED ORDER — MUPIROCIN 2 % EX OINT
1.0000 "application " | TOPICAL_OINTMENT | Freq: Two times a day (BID) | CUTANEOUS | Status: DC
  Administered 2020-03-01 – 2020-03-04 (×6): 1 via NASAL
  Filled 2020-03-01 (×3): qty 22
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
## 2020-03-02 ENCOUNTER — Inpatient Hospital Stay (HOSPITAL_COMMUNITY): Payer: Medicare Other

## 2020-03-02 ENCOUNTER — Inpatient Hospital Stay (HOSPITAL_COMMUNITY): Payer: Medicare Other | Admitting: Certified Registered Nurse Anesthetist

## 2020-03-02 ENCOUNTER — Encounter (HOSPITAL_COMMUNITY)
Admission: EM | Disposition: A | Discharge status: Home Health | Payer: Self-pay | Source: Home / Self Care | Attending: Student

## 2020-03-02 ENCOUNTER — Encounter (HOSPITAL_COMMUNITY): Payer: Self-pay | Admitting: Orthopedic Surgery

## 2020-03-02 HISTORY — PX: SACRO-ILIAC PINNING: SHX5050

## 2020-03-02 LAB — CBC
HCT: 32.3 % — ABNORMAL LOW (ref 36.0–46.0)
Hemoglobin: 11.1 g/dL — ABNORMAL LOW (ref 12.0–15.0)
MCH: 32.6 pg (ref 26.0–34.0)
MCHC: 34.4 g/dL (ref 30.0–36.0)
MCV: 95 fL (ref 80.0–100.0)
Platelets: UNDETERMINED 10*3/uL (ref 150–400)
RBC: 3.4 MIL/uL — ABNORMAL LOW (ref 3.87–5.11)
RDW: 11.8 % (ref 11.5–15.5)
WBC: 6.7 10*3/uL (ref 4.0–10.5)
nRBC: 0 % (ref 0.0–0.2)

## 2020-03-02 LAB — VITAMIN D 25 HYDROXY (VIT D DEFICIENCY, FRACTURES): Vit D, 25-Hydroxy: 40.9 ng/mL (ref 30–100)

## 2020-03-02 SURGERY — PINNING, SACROILIAC JOINT, PERCUTANEOUS
Anesthesia: General | Site: Pelvis | Laterality: Right

## 2020-03-02 MED ORDER — 0.9 % SODIUM CHLORIDE (POUR BTL) OPTIME
TOPICAL | Status: DC | PRN
  Administered 2020-03-02: 1000 mL

## 2020-03-02 MED ORDER — FENTANYL CITRATE (PF) 250 MCG/5ML IJ SOLN
INTRAMUSCULAR | Status: AC
  Filled 2020-03-02: qty 5

## 2020-03-02 MED ORDER — PHENYLEPHRINE 40 MCG/ML (10ML) SYRINGE FOR IV PUSH (FOR BLOOD PRESSURE SUPPORT)
PREFILLED_SYRINGE | INTRAVENOUS | Status: DC | PRN
  Administered 2020-03-02: 120 ug via INTRAVENOUS

## 2020-03-02 MED ORDER — CHLORHEXIDINE GLUCONATE 0.12 % MT SOLN
OROMUCOSAL | Status: AC
  Administered 2020-03-02: 15 mL
  Filled 2020-03-02: qty 15

## 2020-03-02 MED ORDER — CEFAZOLIN SODIUM-DEXTROSE 2-4 GM/100ML-% IV SOLN
2.0000 g | Freq: Three times a day (TID) | INTRAVENOUS | Status: AC
  Administered 2020-03-02 – 2020-03-03 (×3): 2 g via INTRAVENOUS
  Filled 2020-03-02 (×3): qty 100

## 2020-03-02 MED ORDER — MIDAZOLAM HCL 5 MG/5ML IJ SOLN
INTRAMUSCULAR | Status: DC | PRN
  Administered 2020-03-02: 2 mg via INTRAVENOUS

## 2020-03-02 MED ORDER — LIDOCAINE 2% (20 MG/ML) 5 ML SYRINGE
INTRAMUSCULAR | Status: DC | PRN
  Administered 2020-03-02: 60 mg via INTRAVENOUS

## 2020-03-02 MED ORDER — LACTATED RINGERS IV SOLN: INTRAVENOUS | Status: DC | PRN

## 2020-03-02 MED ORDER — ROCURONIUM BROMIDE 10 MG/ML (PF) SYRINGE
PREFILLED_SYRINGE | INTRAVENOUS | Status: DC | PRN
  Administered 2020-03-02: 50 mg via INTRAVENOUS

## 2020-03-02 MED ORDER — MIDAZOLAM HCL 2 MG/2ML IJ SOLN
INTRAMUSCULAR | Status: AC
  Filled 2020-03-02: qty 2

## 2020-03-02 MED ORDER — FENTANYL CITRATE (PF) 100 MCG/2ML IJ SOLN
25.0000 ug | INTRAMUSCULAR | Status: DC | PRN
  Administered 2020-03-02 (×2): 50 ug via INTRAVENOUS

## 2020-03-02 MED ORDER — FENTANYL CITRATE (PF) 100 MCG/2ML IJ SOLN
INTRAMUSCULAR | Status: DC | PRN
  Administered 2020-03-02 (×5): 50 ug via INTRAVENOUS

## 2020-03-02 MED ORDER — CHLORHEXIDINE GLUCONATE 0.12 % MT SOLN
15.0000 mL | OROMUCOSAL | Status: AC
  Filled 2020-03-02: qty 15

## 2020-03-02 MED ORDER — LACTATED RINGERS IV SOLN: INTRAVENOUS | Status: DC

## 2020-03-02 MED ORDER — HYDROMORPHONE HCL 1 MG/ML IJ SOLN
0.2500 mg | INTRAMUSCULAR | Status: DC | PRN
  Administered 2020-03-02 (×2): 0.5 mg via INTRAVENOUS

## 2020-03-02 MED ORDER — DEXAMETHASONE SODIUM PHOSPHATE 10 MG/ML IJ SOLN
INTRAMUSCULAR | Status: DC | PRN
Start: 2020-03-02 — End: 2020-03-02
  Administered 2020-03-02: 10 mg via INTRAVENOUS

## 2020-03-02 MED ORDER — PHENYLEPHRINE 40 MCG/ML (10ML) SYRINGE FOR IV PUSH (FOR BLOOD PRESSURE SUPPORT)
PREFILLED_SYRINGE | INTRAVENOUS | Status: AC
  Filled 2020-03-02: qty 10

## 2020-03-02 MED ORDER — FENTANYL CITRATE (PF) 100 MCG/2ML IJ SOLN
INTRAMUSCULAR | Status: AC
  Filled 2020-03-02: qty 2

## 2020-03-02 MED ORDER — ENOXAPARIN SODIUM 40 MG/0.4ML ~~LOC~~ SOLN
40.0000 mg | SUBCUTANEOUS | Status: DC
  Administered 2020-03-03 – 2020-03-04 (×2): 40 mg via SUBCUTANEOUS
  Filled 2020-03-02 (×2): qty 0.4

## 2020-03-02 MED ORDER — PROPOFOL 10 MG/ML IV BOLUS
INTRAVENOUS | Status: AC
  Filled 2020-03-02: qty 20

## 2020-03-02 MED ORDER — SUGAMMADEX SODIUM 200 MG/2ML IV SOLN
INTRAVENOUS | Status: DC | PRN
  Administered 2020-03-02: 200 mg via INTRAVENOUS

## 2020-03-02 MED ORDER — PROPOFOL 10 MG/ML IV BOLUS
INTRAVENOUS | Status: DC | PRN
  Administered 2020-03-02: 120 mg via INTRAVENOUS

## 2020-03-02 MED ORDER — ONDANSETRON HCL 4 MG/2ML IJ SOLN
INTRAMUSCULAR | Status: DC | PRN
  Administered 2020-03-02: 4 mg via INTRAVENOUS

## 2020-03-02 MED ORDER — HYDROMORPHONE HCL 1 MG/ML IJ SOLN
INTRAMUSCULAR | Status: AC
  Filled 2020-03-02: qty 1

## 2020-03-02 SURGICAL SUPPLY — 44 items
APL PRP STRL LF DISP 70% ISPRP (MISCELLANEOUS) ×1
BIT DRILL CANN 4.5MM (BIT) IMPLANT
BLADE SURG 11 STRL SS (BLADE) ×2 IMPLANT
BRUSH SCRUB EZ PLAIN DRY (MISCELLANEOUS) ×4 IMPLANT
CHLORAPREP W/TINT 26 (MISCELLANEOUS) ×3 IMPLANT
COVER SURGICAL LIGHT HANDLE (MISCELLANEOUS) ×3 IMPLANT
COVER WAND RF STERILE (DRAPES) ×1 IMPLANT
DRAPE C-ARM 42X72 X-RAY (DRAPES) IMPLANT
DRAPE C-ARMOR (DRAPES) ×1 IMPLANT
DRAPE INCISE IOBAN 66X45 STRL (DRAPES) ×5 IMPLANT
DRAPE LAPAROTOMY TRNSV 102X78 (DRAPES) ×3 IMPLANT
DRAPE U-SHAPE 47X51 STRL (DRAPES) ×3 IMPLANT
DRILL BIT CANN 4.5MM (BIT) ×2
DRSG MEPILEX BORDER 4X4 (GAUZE/BANDAGES/DRESSINGS) ×3 IMPLANT
ELECT REM PT RETURN 9FT ADLT (ELECTROSURGICAL) ×3
ELECTRODE REM PT RTRN 9FT ADLT (ELECTROSURGICAL) ×1 IMPLANT
GLOVE BIO SURGEON STRL SZ 6.5 (GLOVE) ×6 IMPLANT
GLOVE BIO SURGEON STRL SZ7.5 (GLOVE) ×12 IMPLANT
GLOVE BIO SURGEONS STRL SZ 6.5 (GLOVE) ×3
GLOVE BIOGEL PI IND STRL 6.5 (GLOVE) ×1 IMPLANT
GLOVE BIOGEL PI IND STRL 7.5 (GLOVE) ×1 IMPLANT
GLOVE BIOGEL PI INDICATOR 6.5 (GLOVE) ×2
GLOVE BIOGEL PI INDICATOR 7.5 (GLOVE) ×2
GOWN STRL REUS W/ TWL LRG LVL3 (GOWN DISPOSABLE) ×2 IMPLANT
GOWN STRL REUS W/TWL LRG LVL3 (GOWN DISPOSABLE) ×6
GUIDEWIRE 2.0MM (WIRE) ×4 IMPLANT
GUIDEWIRE THREADED 2.8MM (WIRE) ×4 IMPLANT
KIT BASIN OR (CUSTOM PROCEDURE TRAY) ×3 IMPLANT
KIT TURNOVER KIT B (KITS) ×3 IMPLANT
MANIFOLD NEPTUNE II (INSTRUMENTS) ×1 IMPLANT
NS IRRIG 1000ML POUR BTL (IV SOLUTION) ×3 IMPLANT
PACK GENERAL/GYN (CUSTOM PROCEDURE TRAY) ×3 IMPLANT
PAD ARMBOARD 7.5X6 YLW CONV (MISCELLANEOUS) ×6 IMPLANT
SCREW CANN FT 7.3X80 (Screw) ×2 IMPLANT
SCREW LOCK CANN FT 7.3X135 (Screw) ×2 IMPLANT
STAPLER VISISTAT 35W (STAPLE) ×3 IMPLANT
SUT ETHILON 3 0 PS 1 (SUTURE) ×1 IMPLANT
SUT MNCRL AB 3-0 PS2 18 (SUTURE) ×5 IMPLANT
SUT VIC AB 2-0 FS1 27 (SUTURE) ×1 IMPLANT
TOWEL GREEN STERILE (TOWEL DISPOSABLE) ×6 IMPLANT
TOWEL GREEN STERILE FF (TOWEL DISPOSABLE) ×3 IMPLANT
UNDERPAD 30X36 HEAVY ABSORB (UNDERPADS AND DIAPERS) ×1 IMPLANT
WASHER FOR 5.0 SCREWS (Washer) ×4 IMPLANT
WATER STERILE IRR 1000ML POUR (IV SOLUTION) ×1 IMPLANT

## 2020-03-02 NOTE — Progress Notes (Signed)
OT Cancellation Note  Patient Details Name: Victoria Ayers MRN: [id number] DOB: 1980/12/19   Cancelled Treatment:    Reason Eval/Treat Not Completed: Patient at procedure or test/ unavailable ( R SI screw fixation of posterior pelvic ring ). OT will continue efforts toward completion of evaluation post-op.   Gloris Manchester OTR/L Supplemental OT, Department of rehab services [phone]  Taniaya Rudder R H.  03/02/2020, 7:47 AM

## 2020-03-02 NOTE — OR Nursing (Signed)
Ring removed from patient's right ring finger by Dr. Doreatha Martin prior to procedure start. Ring labeled in bag and put in patient's chart.

## 2020-03-02 NOTE — Transfer of Care (Signed)
Immediate Anesthesia Transfer of Care Note  Patient: Timmi Devora  Procedure(s) Performed: SACRO-ILIAC PINNING (Right Pelvis)  Patient Location: PACU  Anesthesia Type:General  Level of Consciousness: awake, alert  and oriented  Airway & Oxygen Therapy: Patient Spontanous Breathing and Patient connected to nasal cannula oxygen  Post-op Assessment: Report given to RN and Post -op Vital signs reviewed and stable  Post vital signs: Reviewed and stable  Last Vitals:  Vitals Value Taken Time  BP 123/81 03/02/20 1030  Temp    Pulse 62 03/02/20 1031  Resp 16 03/02/20 1031  SpO2 100 % 03/02/20 1031  Vitals shown include unvalidated device data.  Last Pain:  Vitals:   03/02/20 0548  TempSrc:   PainSc: Asleep         Complications: No complications documented.

## 2020-03-02 NOTE — Progress Notes (Signed)
Pt has left the unit for surgery.

## 2020-03-02 NOTE — Progress Notes (Addendum)
PT Cancellation Note  Patient Details Name: Victoria Ayers MRN: [id number] DOB: 06/08/1981   Cancelled Treatment:    Reason Eval/Treat Not Completed: Patient at procedure or test/unavailable Pt at [facility] pinning on first attempt; leaving for CT on second attempt to see patient.    Wyona Almas, PT, DPT Acute Rehabilitation Services Pager [phone] Office [phone]     Wyona Almas, [place], DPT Acute Rehabilitation Services Pager [phone] Office [phone]    Deno Etienne 03/02/2020, 11:27 AM

## 2020-03-02 NOTE — TOC CAGE-AID Note (Addendum)
Transition of Care [facility]) - CAGE-AID Screening   Patient Details  Name: Victoria Ayers MRN: [id number] Date of Birth: 01-19-1981  [facility] CM/SW Contact:    Emeterio Reeve, [place] Phone Number: 03/02/2020, 9:04 AM   Clinical Narrative:  CSW met with pt at bedside. CSW introduced self and explained her role at the hospital. Pt denied alcohol use. Pt denied current substance use. Pt states she does MAT at [facility]. Pt states she only takes medications prescribed to her and is doing well in treatment.    CAGE-AID Screening:    Have You Ever Felt You Ought to Cut Down on Your Drinking or Drug Use?: No Have People Annoyed You By Critizing Your Drinking Or Drug Use?: No Have You Felt Bad Or Guilty About Your Drinking Or Drug Use?: No Have You Ever Had a Drink or Used Drugs First Thing In The Morning to STeady Your Nerves or to Get Rid of a Hangover?: No CAGE-AID Score: 0  Substance Abuse Education Offered: Yes  Substance abuse interventions: Patient Counseling, Educational Materials   Emeterio Reeve, Latanya Presser, [place] Social Worker [phone]

## 2020-03-02 NOTE — Anesthesia Postprocedure Evaluation (Signed)
Anesthesia Post Note  Patient: Victoria Ayers  Procedure(s) Performed: SACRO-ILIAC PINNING (Right Pelvis)     Patient location during evaluation: PACU Anesthesia Type: General Level of consciousness: awake and alert Pain management: pain level controlled Vital Signs Assessment: post-procedure vital signs reviewed and stable Respiratory status: spontaneous breathing, nonlabored ventilation, respiratory function stable and patient connected to nasal cannula oxygen Cardiovascular status: blood pressure returned to baseline and stable Postop Assessment: no apparent nausea or vomiting Anesthetic complications: no   No complications documented.  Last Vitals:  Vitals:   03/02/20 1030 03/02/20 1045  BP: 123/81 127/83  Pulse: 65 68  Resp: 13 14  Temp: 36.9 C   SpO2: 100% 100%               Mareli Antunes,W. EDMOND

## 2020-03-02 NOTE — Anesthesia Preprocedure Evaluation (Addendum)
Anesthesia Evaluation  Patient identified by MRN, date of birth, ID band Patient awake    Reviewed: Allergy & Precautions, H&P , NPO status , Patient's Chart, lab work & pertinent test results  Airway Mallampati: II  TM Distance: >3 FB Neck ROM: Full    Dental no notable dental hx. (+) Teeth Intact, Dental Advisory Given   Pulmonary Current Smoker and Patient abstained from smoking.,    Pulmonary exam normal breath sounds clear to auscultation       Cardiovascular negative cardio ROS   Rhythm:Regular Rate:Normal     Neuro/Psych Seizures -, Well Controlled,  negative psych ROS   GI/Hepatic negative GI ROS, Neg liver ROS,   Endo/Other  negative endocrine ROS  Renal/GU negative Renal ROS  negative genitourinary   Musculoskeletal   Abdominal   Peds  Hematology negative hematology ROS (+)   Anesthesia Other Findings   Reproductive/Obstetrics negative OB ROS                            Anesthesia Physical Anesthesia Plan  ASA: II  Anesthesia Plan: General   Post-op Pain Management:    Induction: Intravenous  PONV Risk Score and Plan: 3 and Ondansetron, Dexamethasone and Midazolam  Airway Management Planned: Oral ETT  Additional Equipment:   Intra-op Plan:   Post-operative Plan: Extubation in OR  Informed Consent: I have reviewed the patients History and Physical, chart, labs and discussed the procedure including the risks, benefits and alternatives for the proposed anesthesia with the patient or authorized representative who has indicated his/her understanding and acceptance.     Dental advisory given  Plan Discussed with: CRNA  Anesthesia Plan Comments:         Anesthesia Quick Evaluation
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
## 2020-03-02 NOTE — Plan of Care (Signed)
  Problem: Pain Managment: Goal: General experience of comfort will improve Outcome: Progressing   

## 2020-03-02 NOTE — Plan of Care (Signed)
  Problem: Education: Goal: Knowledge of General Education information will improve Description: Including pain rating scale, medication(s)/side effects and non-pharmacologic comfort measures Outcome: Progressing   Problem: Education: Goal: Knowledge of General Education information will improve Description: Including pain rating scale, medication(s)/side effects and non-pharmacologic comfort measures Outcome: Progressing   Problem: Education: Goal: Knowledge of General Education information will improve Description: Including pain rating scale, medication(s)/side effects and non-pharmacologic comfort measures Outcome: Progressing   Problem: Education: Goal: Knowledge of General Education information will improve Description: Including pain rating scale, medication(s)/side effects and non-pharmacologic comfort measures Outcome: Progressing   Problem: Education: Goal: Knowledge of General Education information will improve Description: Including pain rating scale, medication(s)/side effects and non-pharmacologic comfort measures Outcome: Progressing   Problem: Education: Goal: Knowledge of General Education information will improve Description: Including pain rating scale, medication(s)/side effects and non-pharmacologic comfort measures Outcome: Progressing   Problem: Education: Goal: Knowledge of General Education information will improve Description: Including pain rating scale, medication(s)/side effects and non-pharmacologic comfort measures Outcome: Progressing   Problem: Education: Goal: Knowledge of General Education information will improve Description: Including pain rating scale, medication(s)/side effects and non-pharmacologic comfort measures Outcome: Progressing   Problem: Education: Goal: Knowledge of General Education information will improve Description: Including pain rating scale, medication(s)/side effects and non-pharmacologic comfort measures Outcome:  Progressing

## 2020-03-02 NOTE — Plan of Care (Signed)
  Problem: Education: Goal: Knowledge of General Education information will improve Description: Including pain rating scale, medication(s)/side effects and non-pharmacologic comfort measures Outcome: Progressing   Problem: Education: Goal: Knowledge of General Education information will improve Description: Including pain rating scale, medication(s)/side effects and non-pharmacologic comfort measures Outcome: Progressing   Problem: Education: Goal: Knowledge of General Education information will improve Description: Including pain rating scale, medication(s)/side effects and non-pharmacologic comfort measures Outcome: Progressing   Problem: Education: Goal: Knowledge of General Education information will improve Description: Including pain rating scale, medication(s)/side effects and non-pharmacologic comfort measures Outcome: Progressing   Problem: Education: Goal: Knowledge of General Education information will improve Description: Including pain rating scale, medication(s)/side effects and non-pharmacologic comfort measures Outcome: Progressing   Problem: Education: Goal: Knowledge of General Education information will improve Description: Including pain rating scale, medication(s)/side effects and non-pharmacologic comfort measures Outcome: Progressing   Problem: Education: Goal: Knowledge of General Education information will improve Description: Including pain rating scale, medication(s)/side effects and non-pharmacologic comfort measures Outcome: Progressing   Problem: Education: Goal: Knowledge of General Education information will improve Description: Including pain rating scale, medication(s)/side effects and non-pharmacologic comfort measures Outcome: Progressing
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
## 2020-03-02 NOTE — Anesthesia Procedure Notes (Signed)
Procedure Name: Intubation Date/Time: 03/02/2020 9:10 AM Performed by: Candis Shine, CRNA Pre-anesthesia Checklist: Patient identified, Emergency Drugs available, Suction available and Patient being monitored Patient Re-evaluated:Patient Re-evaluated prior to induction Oxygen Delivery Method: Circle System Utilized Preoxygenation: Pre-oxygenation with 100% oxygen Induction Type: IV induction Ventilation: Mask ventilation without difficulty Laryngoscope Size: Mac and 3 Grade View: Grade I Tube type: Oral Tube size: 7.0 mm Number of attempts: 1 Airway Equipment and Method: Stylet Placement Confirmation: ETT inserted through vocal cords under direct vision,  positive ETCO2 and breath sounds checked- equal and bilateral Secured at: 22 cm Tube secured with: Tape Dental Injury: Teeth and Oropharynx as per pre-operative assessment

## 2020-03-03 LAB — CBC
HCT: 33.3 % — ABNORMAL LOW (ref 36.0–46.0)
Hemoglobin: 11.4 g/dL — ABNORMAL LOW (ref 12.0–15.0)
MCH: 32.9 pg (ref 26.0–34.0)
MCHC: 34.2 g/dL (ref 30.0–36.0)
MCV: 96 fL (ref 80.0–100.0)
Platelets: 132 10*3/uL — ABNORMAL LOW (ref 150–400)
RBC: 3.47 MIL/uL — ABNORMAL LOW (ref 3.87–5.11)
RDW: 11.6 % (ref 11.5–15.5)
WBC: 9.1 10*3/uL (ref 4.0–10.5)
nRBC: 0 % (ref 0.0–0.2)
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
## 2020-03-03 NOTE — Plan of Care (Signed)
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
## 2020-03-04 DIAGNOSIS — Z23 Encounter for immunization: Secondary | ICD-10-CM | POA: Diagnosis not present

## 2020-03-04 DIAGNOSIS — S32811A Multiple fractures of pelvis with unstable disruption of pelvic ring, initial encounter for closed fracture: Secondary | ICD-10-CM | POA: Diagnosis not present

## 2020-03-04 LAB — CBC
HCT: 34.3 % — ABNORMAL LOW (ref 36.0–46.0)
Hemoglobin: 11.7 g/dL — ABNORMAL LOW (ref 12.0–15.0)
MCH: 33 pg (ref 26.0–34.0)
MCHC: 34.1 g/dL (ref 30.0–36.0)
MCV: 96.6 fL (ref 80.0–100.0)
Platelets: 159 10*3/uL (ref 150–400)
RBC: 3.55 MIL/uL — ABNORMAL LOW (ref 3.87–5.11)
RDW: 11.8 % (ref 11.5–15.5)
WBC: 8.1 10*3/uL (ref 4.0–10.5)
nRBC: 0 % (ref 0.0–0.2)

## 2020-03-04 LAB — BASIC METABOLIC PANEL
Anion gap: 9 (ref 5–15)
BUN: 7 mg/dL (ref 6–20)
CO2: 29 mmol/L (ref 22–32)
Calcium: 8.6 mg/dL — ABNORMAL LOW (ref 8.9–10.3)
Chloride: 100 mmol/L (ref 98–111)
Creatinine, Ser: 0.64 mg/dL (ref 0.44–1.00)
GFR calc Af Amer: >60 mL/min (ref >60)
GFR calc non Af Amer: >60 mL/min (ref >60)
Glucose, Bld: 91 mg/dL (ref 70–99)
Potassium: 3.4 mmol/L — ABNORMAL LOW (ref 3.5–5.1)
Sodium: 138 mmol/L (ref 135–145)

## 2020-03-04 MED ORDER — ONDANSETRON HCL 4 MG PO TABS: 4.0000 mg | ORAL_TABLET | Freq: Three times a day (TID) | ORAL | 1 refills | Status: DC | PRN

## 2020-03-04 MED ORDER — METHOCARBAMOL 750 MG PO TABS: 750.0000 mg | ORAL_TABLET | Freq: Three times a day (TID) | ORAL | 0 refills | Status: AC | PRN

## 2020-03-04 MED ORDER — OXYCODONE HCL 5 MG PO TABS: ORAL_TABLET | ORAL | 0 refills | Status: AC

## 2020-03-04 MED ORDER — ENOXAPARIN SODIUM 40 MG/0.4ML ~~LOC~~ SOLN
40.0000 mg | SUBCUTANEOUS | 0 refills | Status: AC
Start: 2020-03-04 — End: 2020-04-03

## 2020-03-04 MED ORDER — OXYCODONE HCL 5 MG PO TABS: ORAL_TABLET | ORAL | 0 refills | Status: DC

## 2020-03-04 MED ORDER — METHOCARBAMOL 750 MG PO TABS
750.0000 mg | ORAL_TABLET | Freq: Three times a day (TID) | ORAL | 0 refills | Status: DC | PRN
Start: 2020-03-04 — End: 2020-03-04

## 2020-03-04 MED ORDER — ONDANSETRON HCL 4 MG PO TABS: 4.0000 mg | ORAL_TABLET | Freq: Three times a day (TID) | ORAL | 1 refills | Status: AC | PRN

## 2020-03-04 MED ORDER — ENOXAPARIN SODIUM 40 MG/0.4ML ~~LOC~~ SOLN
40.0000 mg | SUBCUTANEOUS | 0 refills | Status: DC
Start: 2020-03-04 — End: 2020-03-04

## 2020-03-04 NOTE — Plan of Care (Signed)
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
## 2020-03-04 NOTE — TOC Transition Note (Signed)
Transition of Care [facility]) - CM/SW Discharge Note   Patient Details  Name: Victoria Ayers MRN: [id number] Date of Birth: Dec 11, 1980  [facility] of [facility] CM/SW Contact:  Claudie Leach, RN  03/04/2020, 2:16 PM   Clinical Narrative:    Patient to d/c home with family and [facility].  Patient in a hurry to leave and did not want to discuss. No answer on patient's room phone or cell phone.    Referral accepted by Corene Cornea with [facility] for PT.     Final next level of care: [place] Barriers to Discharge: No Barriers Identified   Patient Goals and CMS Choice Patient states their goals for this hospitalization and ongoing recovery are:: to get home CMS Medicare.gov Compare Post Acute Care list provided to:: Patient Choice offered to / list presented to : Patient   Discharge Plan and Services                DME Arranged:  (Crutches provided by ortho)         HH Arranged: PT [place] Agency: [place] ([place]) Date [place]: 03/04/20 Time [place]: [place] Representative spoke with at [place]: Corene Cornea

## 2020-03-04 NOTE — Plan of Care (Signed)

## 2020-03-04 NOTE — Progress Notes (Signed)
Orthopedic Tech Progress Note Patient Details:  Victoria Ayers 1980-12-09 [id number]  Ortho Devices Type of Ortho Device: Crutches Ortho Device/Splint Interventions: Application, Ordered   Post Interventions Patient Tolerated: Well Instructions Provided: Care of device    Maricela Kawahara A [name] 03/04/2020, 2:21 PM
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
## 2020-03-04 NOTE — Discharge Instructions (Signed)
[facility]uma [facility] Discharge Instructions   General Discharge Instructions  Orthopaedic Injuries: 1. Percutaneous fixation of posterior pelvic ring 2. Closed reduction/treatment of sacral fracture  WEIGHT BEARING STATUS: - weight bearing as tolerated left lower extremity  - toe touch weight bearing right lower extremity   RANGE OF MOTION/ACTIVITY: per your therapist  Wound Care: You may remove the dressing in 3 days. Keep incision clean and dry.   DVT/PE prophylaxis: Lovenox  Diet: as you were eating previously.  Can use over the counter stool softeners and bowel preparations, such as Miralax, to help with bowel movements.  Narcotics can be constipating.  Be sure to drink plenty of fluids  PAIN MEDICATION USE AND EXPECTATIONS  You have likely been given narcotic medications to help control your pain.  After a traumatic event that results in an fracture (broken bone) with or without surgery, it is ok to use narcotic pain medications to help control one's pain.  We understand that everyone responds to pain differently and each individual patient will be evaluated on a regular basis for the continued need for narcotic medications. Ideally, narcotic medication use should last no more than 6-8 weeks (coinciding with fracture healing).   As a patient it is your responsibility as well to monitor narcotic medication use and report the amount and frequency you use these medications when you come to your office visit.   We would also advise that if you are using narcotic medications, you should take a dose prior to therapy to maximize you participation.  IF YOU ARE ON NARCOTIC MEDICATIONS IT IS NOT PERMISSIBLE TO OPERATE A MOTOR VEHICLE (MOTORCYCLE/CAR/TRUCK/MOPED) OR HEAVY MACHINERY DO NOT MIX NARCOTICS WITH OTHER CNS (CENTRAL NERVOUS SYSTEM) DEPRESSANTS SUCH AS ALCOHOL   STOP SMOKING OR USING NICOTINE PRODUCTS!!!!  As discussed nicotine severely impairs your body's ability to heal  surgical and traumatic wounds but also impairs bone healing.  Wounds and bone heal by forming microscopic blood vessels (angiogenesis) and nicotine is a vasoconstrictor (essentially, shrinks blood vessels).  Therefore, if vasoconstriction occurs to these microscopic blood vessels they essentially disappear and are unable to deliver necessary nutrients to the healing tissue.  This is one modifiable factor that you can do to dramatically increase your chances of healing your injury.    (This means no smoking, no nicotine gum, patches, etc)  DO NOT USE NONSTEROIDAL ANTI-INFLAMMATORY DRUGS (NSAID'S)  Using products such as Advil (ibuprofen), Aleve (naproxen), Motrin (ibuprofen) for additional pain control during fracture healing can delay and/or prevent the healing response.  If you would like to take over the counter (OTC) medication, Tylenol (acetaminophen) is ok.  However, some narcotic medications that are given for pain control contain acetaminophen as well. Therefore, you should not exceed more than 4000 mg of tylenol in a day if you do not have liver disease.  Also note that there are may OTC medicines, such as cold medicines and allergy medicines that my contain tylenol as well.  If you have any questions about medications and/or interactions please ask your doctor/PA or your pharmacist.      ICE AND ELEVATE INJURED/OPERATIVE EXTREMITY  Using ice and elevating the injured extremity above your heart can help with swelling and pain control.  Icing in a pulsatile fashion, such as 20 minutes on and 20 minutes off, can be followed.    Do not place ice directly on skin. Make sure there is a barrier between to skin and the ice pack.    Using frozen  items such as frozen peas works well as the conform nicely to the are that needs to be iced.  USE AN ACE WRAP OR TED HOSE FOR SWELLING CONTROL  In addition to icing and elevation, Ace wraps or TED hose are used to help limit and resolve swelling.  It is  recommended to use Ace wraps or TED hose until you are informed to stop.    When using Ace Wraps start the wrapping distally (farthest away from the body) and wrap proximally (closer to the body)   Example: If you had surgery on your leg or thing and you do not have a splint on, start the ace wrap at the toes and work your way up to the thigh        If you had surgery on your upper extremity and do not have a splint on, start the ace wrap at your fingers and work your way up to the upper arm  IF YOU ARE IN A SPLINT OR CAST DO NOT Ava   If your splint gets wet for any reason please contact the office immediately. You may shower in your splint or cast as long as you keep it dry.  This can be done by wrapping in a cast cover or garbage back (or similar)  Do Not stick any thing down your splint or cast such as pencils, money, or hangers to try and scratch yourself with.  If you feel itchy take benadryl as prescribed on the bottle for itching  IF YOU ARE IN A CAM BOOT (BLACK BOOT)  You may remove boot periodically. Perform daily dressing changes as noted below.  Wash the liner of the boot regularly and wear a sock when wearing the boot. It is recommended that you sleep in the boot until told otherwise    Call office for the following:  Temperature greater than 101F  Persistent nausea and vomiting  Severe uncontrolled pain  Redness, tenderness, or signs of infection (pain, swelling, redness, odor or green/yellow discharge around the site)  Difficulty breathing, headache or visual disturbances  Hives  Persistent dizziness or light-headedness  Extreme fatigue  Any other questions or concerns you may have after discharge  In an emergency, call 911 or go to an Emergency Department at a nearby hospital    [place]: [phone]   VISIT OUR WEBSITE FOR ADDITIONAL INFORMATION: [email or web address]      Discharge Pin Site  Instructions  Dress pins daily with Kerlix roll starting on POD 2. Wrap the Kerlix so that it tamps the skin down around the pin-skin interface to prevent/limit motion of the skin relative to the pin.  (Pin-skin motion is the primary cause of pain and infection related to external fixator pin sites).  Remove any crust or coagulum that may obstruct drainage with soap and water.  After POD 3, if there is no discernable drainage on the pin site dressing, the interval for change can by increased to every other day.  You may shower with the fixator, cleaning all pin sites gently with soap and water.  If you have a surgical wound this needs to be completely dry and without drainage before showering. Alternatively you can use a washcloth with soap and water and gently clean the injured extremity and external fixator, including all pinsites and surgical wounds   The extremity can be lifted by the fixator to facilitate wound care and transfers.  Notify the office/Doctor if  you experience increasing drainage, redness, or pain from a pin site, or if you notice purulent (thick, snot-like) drainage.  Discharge Wound Care Instructions  Do NOT apply any ointments, solutions or lotions to pin sites or surgical wounds.  These prevent needed drainage and even though solutions like hydrogen peroxide kill bacteria, they also damage cells lining the pin sites that help fight infection.  Applying lotions or ointments can keep the wounds moist and can cause them to breakdown and open up as well. This can increase the risk for infection. When in doubt call the office.  Surgical incisions should be dressed daily.  If any drainage is noted, use one layer of adaptic, then gauze, Kerlix, and an ace wrap.  Once the incision is completely dry and without drainage, it may be left open to air out.  Showering may begin 36-48 hours later.  Cleaning gently with soap and water.  Traumatic wounds should be dressed daily as well.     One layer of adaptic, gauze, Kerlix, then ace wrap.  The adaptic can be discontinued once the draining has ceased    If you have a wet to dry dressing: wet the gauze with saline the squeeze as much saline out so the gauze is moist (not soaking wet), place moistened gauze over wound, then place a dry gauze over the moist one, followed by Kerlix wrap, then ace wrap.

## 2020-03-04 NOTE — Progress Notes (Signed)
Pt eager to go home, pt up and dressed in room. 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 Pt also says she has a walker and wheelchair at home but requests crutches. PA aware, making orders now, CM states that she will call pt to set up [facility] this afternoon, even if pt has been d/c'ed, per PA. Will continue to monitor until d/c.

## 2020-03-04 NOTE — Progress Notes (Signed)
Pt IV removed and AVS reviewed. Pt DME delivered and family at bedside rushing to go home. Pt aware that CM will call to set up [facility] and is agreeable to that plan. Pt dressed, belongings gathered, and pt taken downstairs with father.

## 2020-03-06 ENCOUNTER — Encounter (HOSPITAL_COMMUNITY): Payer: Self-pay | Admitting: Student
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
## 2024-08-24 ENCOUNTER — Telehealth

## 2024-08-25 ENCOUNTER — Encounter: Payer: Self-pay | Admitting: Physician Assistant

## 2024-08-25 ENCOUNTER — Telehealth
